# Patient Record
Sex: Male | Born: 1980 | Race: Black or African American | Hispanic: No | Marital: Single | Smoking: Current every day smoker
Health system: Southern US, Community
[De-identification: ages and names within clinical notes are randomized; demographics above are authoritative.]

## PROBLEM LIST (undated history)

## (undated) ENCOUNTER — Emergency Department (HOSPITAL_COMMUNITY): Admission: EM | Payer: Self-pay | Source: Home / Self Care

## (undated) DIAGNOSIS — J45909 Unspecified asthma, uncomplicated: Secondary | ICD-10-CM

## (undated) HISTORY — PX: HERNIA REPAIR: SHX51

## (undated) HISTORY — PX: ANKLE SURGERY: SHX546

---

## 1999-01-31 ENCOUNTER — Emergency Department (HOSPITAL_COMMUNITY): Admission: EM | Admit: 1999-01-31 | Discharge: 1999-01-31 | Payer: Self-pay | Admitting: Emergency Medicine

## 2000-07-25 ENCOUNTER — Encounter: Payer: Self-pay | Admitting: Emergency Medicine

## 2000-07-25 ENCOUNTER — Emergency Department (HOSPITAL_COMMUNITY): Admission: EM | Admit: 2000-07-25 | Discharge: 2000-07-25 | Payer: Self-pay | Admitting: Emergency Medicine

## 2001-07-31 ENCOUNTER — Emergency Department (HOSPITAL_COMMUNITY): Admission: EM | Admit: 2001-07-31 | Discharge: 2001-07-31 | Payer: Self-pay | Admitting: Emergency Medicine

## 2004-11-04 ENCOUNTER — Emergency Department (HOSPITAL_COMMUNITY): Admission: EM | Admit: 2004-11-04 | Discharge: 2004-11-04 | Payer: Self-pay | Admitting: Emergency Medicine

## 2008-12-21 ENCOUNTER — Emergency Department (HOSPITAL_COMMUNITY): Admission: EM | Admit: 2008-12-21 | Discharge: 2008-12-21 | Payer: Self-pay | Admitting: Family Medicine

## 2009-02-11 ENCOUNTER — Emergency Department (HOSPITAL_COMMUNITY): Admission: EM | Admit: 2009-02-11 | Discharge: 2009-02-11 | Payer: Self-pay | Admitting: Family Medicine

## 2010-07-11 ENCOUNTER — Emergency Department (HOSPITAL_COMMUNITY)
Admission: EM | Admit: 2010-07-11 | Discharge: 2010-07-11 | Payer: Self-pay | Source: Home / Self Care | Admitting: Emergency Medicine

## 2010-07-29 ENCOUNTER — Ambulatory Visit (HOSPITAL_COMMUNITY)
Admission: RE | Admit: 2010-07-29 | Discharge: 2010-07-30 | Payer: Self-pay | Source: Home / Self Care | Attending: Orthopedic Surgery | Admitting: Orthopedic Surgery

## 2010-07-29 LAB — COMPREHENSIVE METABOLIC PANEL
ALT: 34 U/L (ref 0–53)
AST: 33 U/L (ref 0–37)
Albumin: 3.7 g/dL (ref 3.5–5.2)
Alkaline Phosphatase: 78 U/L (ref 39–117)
BUN: 13 mg/dL (ref 6–23)
CO2: 26 mEq/L (ref 19–32)
Calcium: 9.2 mg/dL (ref 8.4–10.5)
Chloride: 107 mEq/L (ref 96–112)
Creatinine, Ser: 1.32 mg/dL (ref 0.4–1.5)
GFR calc Af Amer: >60 mL/min (ref >60)
GFR calc non Af Amer: >60 mL/min (ref >60)
Glucose, Bld: 87 mg/dL (ref 70–99)
Potassium: 3.9 mEq/L (ref 3.5–5.1)
Sodium: 139 mEq/L (ref 135–145)
Total Bilirubin: 0.8 mg/dL (ref 0.3–1.2)
Total Protein: 7.9 g/dL (ref 6.0–8.3)

## 2010-07-29 LAB — PROTIME-INR
INR: 1.11 (ref 0.00–1.49)
Prothrombin Time: 14.5 seconds (ref 11.6–15.2)

## 2010-07-29 LAB — URINALYSIS, ROUTINE W REFLEX MICROSCOPIC
Bilirubin Urine: NEGATIVE
Hgb urine dipstick: NEGATIVE
Ketones, ur: NEGATIVE mg/dL
Nitrite: NEGATIVE
Protein, ur: NEGATIVE mg/dL
Specific Gravity, Urine: 1.024 (ref 1.005–1.030)
Urine Glucose, Fasting: NEGATIVE mg/dL
Urobilinogen, UA: 0.2 mg/dL (ref 0.0–1.0)
pH: 6.5 (ref 5.0–8.0)

## 2010-07-29 LAB — CBC
HCT: 42.4 % (ref 39.0–52.0)
Hemoglobin: 13.8 g/dL (ref 13.0–17.0)
MCH: 29.8 pg (ref 26.0–34.0)
MCHC: 32.5 g/dL (ref 30.0–36.0)
MCV: 91.6 fL (ref 78.0–100.0)
Platelets: 278 10*3/uL (ref 150–400)
RBC: 4.63 MIL/uL (ref 4.22–5.81)
RDW: 14.2 % (ref 11.5–15.5)
WBC: 6.5 10*3/uL (ref 4.0–10.5)

## 2010-07-29 LAB — SURGICAL PCR SCREEN: Staphylococcus aureus: POSITIVE — AB

## 2010-07-29 LAB — APTT: aPTT: 31 seconds (ref 24–37)

## 2010-08-05 NOTE — Op Note (Signed)
[name], Jimmy Poole             ACCOUNT NO.:  [id number]  MEDICAL RECORD NO.:  [id number]          PATIENT TYPE:  OIB  LOCATION:  1303                         FACILITY:  [facility]  PHYSICIAN:  Ollen Gross, M.D.    DATE OF BIRTH:  Feb 22, 1981  DATE OF PROCEDURE:  07/29/2010 DATE OF DISCHARGE:                              OPERATIVE REPORT   PREOPERATIVE DIAGNOSIS:  Right lateral malleolus fracture.  POSTOPERATIVE DIAGNOSES: 1. Right lateral malleolus fracture. 2. Syndesmosis disruption.  PROCEDURE:  Open reduction and internal fixation of right lateral malleolus and syndesmosis.  SURGEON:  Ollen Gross, M.D.  ASSISTANT:  Avel Peace PA-C.  ANESTHESIA:  General.  ESTIMATED BLOOD LOSS:  Minimal.  DRAINS:  None.  TOURNIQUET TIME:  26 minutes at 280 mmHg.  COMPLICATIONS:  None.  CONDITION:  Stable to recovery.  CLINICAL NOTE:  [name] is a 30 year old male who had a right ankle fracture about 2-1/2 weeks ago.  He said he initially noticed that the foot was angulated and he reduced himself.  He was seen at [facility] ER and placed into a splint.  When I first saw him in the office about 3-4 post injury, the reduction was well maintained.  The next week, he came back and it shifted laterally.  We discussed operative fixation.  He presents now for ORIF.  PROCEDURE IN DETAIL:  After successful administration of general anesthetic, tourniquet placed on the right thigh, the right lower extremity was prepped and draped in usual sterile fashion.  Extremities wrapped in Esmarch tourniquet inflated to 300 mmHg.  The incision was made over the lateral malleolus.  Dissection down to bone.  Lateral malleolar fracture identified.  It is reduced.  There was atypical angulation that is not amenable to a lag screw.  When I reduced this under fluoro guidance, the syndesmosis did not fully reduce.  I placed the cancellus screw distal to the fracture, cortical screw proximal through a seven  hole one-third tubular side plate.  Under live fluoro, I then placed the angle in full range of motion, and I placed a clamp around the fibula.  I was able to disrupt the syndesmosis and that was the reason why it was not reducing.  When I reduced the syndesmosis, the talus was then centered in the ankle mortise.  Reduced the syndesmosis and then through the two holes just proximal to the fracture, I placed syndesmosis screws, one of them captured four cortices and on captured three.  This effectively reduced the syndesmosis to make the mortise symmetric and keep the talus symmetric in the mortise.  I then filled the rest of the holes proximal cortical screws for more cancellus screw distal to the fracture.  Multiple views were taken.  Showed anatomic reduction of fracture and the mortise.  Then, thoroughly irrigated the wound and closed with interrupted 2-0 Vicryl and staples.  Tourniquet released at total time of 26 minutes.  Incisions cleaned and dried and bulky sterile dressing applied.  He was placed into a Cam walker and then awakened and transferred to recovery in stable condition.     Ollen Gross, M.D.     FA/MEDQ  D:  07/29/2010  T:  07/29/2010  Job:  [id number]  Electronically Signed by Ollen Gross M.D. on 08/05/2010 03:37:33 PM

## 2010-12-23 ENCOUNTER — Emergency Department (HOSPITAL_COMMUNITY)
Admission: EM | Admit: 2010-12-23 | Discharge: 2010-12-23 | Disposition: A | Discharge status: Home / Self Care | Payer: Self-pay | Attending: Emergency Medicine | Admitting: Emergency Medicine

## 2010-12-23 DIAGNOSIS — H9190 Unspecified hearing loss, unspecified ear: Secondary | ICD-10-CM | POA: Insufficient documentation

## 2010-12-23 DIAGNOSIS — H6120 Impacted cerumen, unspecified ear: Secondary | ICD-10-CM | POA: Insufficient documentation

## 2012-01-15 ENCOUNTER — Emergency Department (HOSPITAL_COMMUNITY)
Admission: EM | Admit: 2012-01-15 | Discharge: 2012-01-15 | Disposition: A | Discharge status: Home / Self Care | Payer: Self-pay | Attending: Emergency Medicine | Admitting: Emergency Medicine

## 2012-01-15 ENCOUNTER — Encounter (HOSPITAL_COMMUNITY): Payer: Self-pay | Admitting: Emergency Medicine

## 2012-01-15 DIAGNOSIS — IMO0001 Reserved for inherently not codable concepts without codable children: Secondary | ICD-10-CM

## 2012-01-15 DIAGNOSIS — R531 Weakness: Secondary | ICD-10-CM

## 2012-01-15 DIAGNOSIS — F17200 Nicotine dependence, unspecified, uncomplicated: Secondary | ICD-10-CM | POA: Insufficient documentation

## 2012-01-15 DIAGNOSIS — K921 Melena: Secondary | ICD-10-CM | POA: Insufficient documentation

## 2012-01-15 HISTORY — DX: Unspecified asthma, uncomplicated: J45.909

## 2012-01-15 LAB — CBC WITH DIFFERENTIAL/PLATELET
Basophils Relative: 0 % (ref 0–1)
Eosinophils Absolute: 0 10*3/uL (ref 0.0–0.7)
MCH: 29.9 pg (ref 26.0–34.0)
MCHC: 33.1 g/dL (ref 30.0–36.0)
Monocytes Relative: 10 % (ref 3–12)
Neutrophils Relative %: 60 % (ref 43–77)
Platelets: 254 10*3/uL (ref 150–400)

## 2012-01-15 LAB — OCCULT BLOOD, POC DEVICE: Fecal Occult Bld: POSITIVE

## 2012-01-15 LAB — BASIC METABOLIC PANEL
BUN: 10 mg/dL (ref 6–23)
Calcium: 9.1 mg/dL (ref 8.4–10.5)
GFR calc Af Amer: 85 mL/min — ABNORMAL LOW (ref >90)
GFR calc non Af Amer: 74 mL/min — ABNORMAL LOW (ref >90)
Potassium: 3.3 mEq/L — ABNORMAL LOW (ref 3.5–5.1)
Sodium: 139 mEq/L (ref 135–145)

## 2012-01-15 MED ORDER — PANTOPRAZOLE SODIUM 40 MG IV SOLR
40.0000 mg | Freq: Once | INTRAVENOUS | Status: AC
  Administered 2012-01-15: 40 mg via INTRAVENOUS
  Filled 2012-01-15: qty 40

## 2012-01-15 MED ORDER — SODIUM CHLORIDE 0.9 % IV BOLUS (SEPSIS)
1000.0000 mL | Freq: Once | INTRAVENOUS | Status: AC
  Administered 2012-01-15: 1000 mL via INTRAVENOUS

## 2012-01-15 NOTE — ED Notes (Signed)
Pt reports blood in stool onset yesterday. Pt reports having 5 episodes of dark red blood in stool yesterday. Pt denies abdominal pain, N/V.

## 2012-01-15 NOTE — ED Notes (Signed)
Pt brought back to room from triage; pt undressing from waist down and putting on gown at this time

## 2012-01-15 NOTE — ED Provider Notes (Signed)
Medical screening examination/treatment/procedure(s) were performed by non-physician practitioner and as supervising physician I was immediately available for consultation/collaboration.   Gavin Pound. Trai Ells, MD 01/15/12 1347

## 2012-01-15 NOTE — ED Provider Notes (Signed)
History     CSN: [id number]  Arrival date & time 01/15/12  1148   First MD Initiated Contact with Patient 01/15/12 1157      Chief Complaint  Patient presents with  . Rectal Bleeding    (Consider location/radiation/quality/duration/timing/severity/associated sxs/prior treatment) HPI Comments: Patient here with sudden onset of 5 episodes of dark red blood in stool yesterday. Stool is formed but loose. No pain with defacation. Notices spots of dark blood in underpants. No abdominal pain, nausea, fever. Feels slightly lightheaded and weak. Drinking fluids. No recent travel. Denies any personal or family hx of IBD or GI problems. No hx of rectal bleeding.  Patient is a 31 y.o. male presenting with hematochezia. The history is provided by the patient.  Rectal Bleeding  The current episode started yesterday. The patient is experiencing no pain. The stool is described as mixed with blood. Pertinent negatives include no fever, no abdominal pain, no diarrhea, no nausea, no rectal pain, no chest pain and no rash.    Past Medical History  Diagnosis Date  . Asthma     Past Surgical History  Procedure Date  . Hernia repair   . Ankle surgery     History reviewed. No pertinent family history.  History  Substance Use Topics  . Smoking status: Current Everyday Smoker  . Smokeless tobacco: Not on file  . Alcohol Use: Yes      Review of Systems  Constitutional: Positive for fatigue. Negative for fever and appetite change.  Respiratory: Negative for shortness of breath.   Cardiovascular: Negative for chest pain.  Gastrointestinal: Positive for blood in stool and hematochezia. Negative for nausea, abdominal pain, diarrhea, constipation and rectal pain.  Genitourinary: Negative for dysuria and difficulty urinating.  Skin: Negative for rash.  Neurological: Positive for light-headedness.    Allergies  Review of patient's allergies indicates no known allergies.  Home Medications  No  current outpatient prescriptions on file.  BP 138/90  Pulse 97  Temp 98.5 F (36.9 C) (Oral)  Resp 16  SpO2 95%  Physical Exam  Nursing note and vitals reviewed. Constitutional: He is oriented to person, place, and time. He appears well-developed and well-nourished.  HENT:  Head: Normocephalic and atraumatic.  Mouth/Throat: Oropharynx is clear and moist.  Eyes: Conjunctivae are normal. No scleral icterus.  Cardiovascular: Normal rate, regular rhythm and normal heart sounds.   Pulmonary/Chest: Effort normal and breath sounds normal.  Abdominal: Soft. Bowel sounds are normal. There is no tenderness.  Genitourinary: Rectum normal. Rectal exam shows no external hemorrhoid, no internal hemorrhoid, no fissure and no tenderness. Prostate is not enlarged and not tender.  Neurological: He is alert and oriented to person, place, and time.  Skin: No rash noted. No pallor.    ED Course  Procedures (including critical care time)   Labs Reviewed  CBC WITH DIFFERENTIAL  BASIC METABOLIC PANEL  OCCULT BLOOD X 1 CARD TO LAB, STOOL   No results found. Results for orders placed during the hospital encounter of 01/15/12  CBC WITH DIFFERENTIAL      Component Value Range   WBC 7.3  4.0 - 10.5 K/uL   RBC 4.61  4.22 - 5.81 MIL/uL   Hemoglobin 13.8  13.0 - 17.0 g/dL   HCT 40.9  81.1 - 91.4 %   MCV 90.5  78.0 - 100.0 fL   MCH 29.9  26.0 - 34.0 pg   MCHC 33.1  30.0 - 36.0 g/dL   RDW 78.2  95.6 - 21.3 %  Platelets 254  150 - 400 K/uL   Neutrophils Relative 60  43 - 77 %   Neutro Abs 4.4  1.7 - 7.7 K/uL   Lymphocytes Relative 29  12 - 46 %   Lymphs Abs 2.1  0.7 - 4.0 K/uL   Monocytes Relative 10  3 - 12 %   Monocytes Absolute 0.7  0.1 - 1.0 K/uL   Eosinophils Relative 0  0 - 5 %   Eosinophils Absolute 0.0  0.0 - 0.7 K/uL   Basophils Relative 0  0 - 1 %   Basophils Absolute 0.0  0.0 - 0.1 K/uL  BASIC METABOLIC PANEL      Component Value Range   Sodium 139  135 - 145 mEq/L   Potassium 3.3  (*) 3.5 - 5.1 mEq/L   Chloride 101  96 - 112 mEq/L   CO2 25  19 - 32 mEq/L   Glucose, Bld 95  70 - 99 mg/dL   BUN 10  6 - 23 mg/dL   Creatinine, Ser 4.54  0.50 - 1.35 mg/dL   Calcium 9.1  8.4 - 09.8 mg/dL   GFR calc non Af Amer 74 (*) >90 mL/min   GFR calc Af Amer 85 (*) >90 mL/min  OCCULT BLOOD, POC DEVICE      Component Value Range   Fecal Occult Bld POSITIVE        No diagnosis found. Dx- blood in stool   MDM  Occult blood trace positive occult blood. Fluid management and await lab results. 1:26 PM Labs WNL. Patient feeling stronger since fluid bolus. No active bleeding seen on exam. Hold GI referral at this time. Explained possibility of internal hemorrhoid. Aware to return if s/s worsen or if he weakens. Stressed importance of hydration.         Trevor Mace, PA-C 01/15/12 1335

## 2012-01-15 NOTE — ED Notes (Signed)
Pt getting dressed to be discharged home at this time

## 2012-01-30 ENCOUNTER — Encounter (HOSPITAL_COMMUNITY): Payer: Self-pay | Admitting: Emergency Medicine

## 2012-01-30 ENCOUNTER — Emergency Department (HOSPITAL_COMMUNITY)
Admission: EM | Admit: 2012-01-30 | Discharge: 2012-01-30 | Disposition: A | Discharge status: Home / Self Care | Payer: Self-pay | Source: Home / Self Care | Attending: Emergency Medicine | Admitting: Emergency Medicine

## 2012-01-30 DIAGNOSIS — N342 Other urethritis: Secondary | ICD-10-CM

## 2012-01-30 MED ORDER — CEFTRIAXONE SODIUM 250 MG IJ SOLR
250.0000 mg | Freq: Once | INTRAMUSCULAR | Status: AC
  Administered 2012-01-30: 250 mg via INTRAMUSCULAR

## 2012-01-30 MED ORDER — AZITHROMYCIN 250 MG PO TABS
1000.0000 mg | ORAL_TABLET | Freq: Every day | ORAL | Status: DC
  Administered 2012-01-30: 1000 mg via ORAL

## 2012-01-30 MED ORDER — CEFTRIAXONE SODIUM 250 MG IJ SOLR
INTRAMUSCULAR | Status: AC
  Filled 2012-01-30: qty 250

## 2012-01-30 MED ORDER — AZITHROMYCIN 250 MG PO TABS
ORAL_TABLET | ORAL | Status: AC
  Filled 2012-01-30: qty 4

## 2012-01-30 NOTE — ED Provider Notes (Signed)
History     CSN: [id number]  Arrival date & time 01/30/12  1122   First MD Initiated Contact with Patient 01/30/12 1126      Chief Complaint  Patient presents with  . Penile Discharge    (Consider location/radiation/quality/duration/timing/severity/associated sxs/prior treatment) HPI Comments: Patient presents to urgent care this morning complaining of a 2 day history of a milky white to yellowish drainage from his penis. He describes a bit of discomfort with urination but no pain, no abdominal pain fevers nausea or vomiting. He does admit to having unprotected sex occasionally.  Patient is a 31 y.o. male presenting with penile discharge. The history is provided by the patient.  Penile Discharge The problem occurs constantly. The problem has been gradually worsening. Pertinent negatives include no abdominal pain and no headaches. Nothing relieves the symptoms. He has tried nothing for the symptoms.    Past Medical History  Diagnosis Date  . Asthma     Past Surgical History  Procedure Date  . Hernia repair   . Ankle surgery     History reviewed. No pertinent family history.  History  Substance Use Topics  . Smoking status: Current Everyday Smoker  . Smokeless tobacco: Not on file  . Alcohol Use: Yes      Review of Systems  Constitutional: Negative for fever and chills.  Gastrointestinal: Negative for abdominal pain.  Genitourinary: Positive for discharge. Negative for urgency, flank pain, decreased urine volume, penile swelling, scrotal swelling, genital sores, penile pain and testicular pain.  Neurological: Negative for headaches.    Allergies  Review of patient's allergies indicates no known allergies.  Home Medications  No current outpatient prescriptions on file.  BP 136/79  Pulse 64  Temp 98.3 F (36.8 C) (Oral)  Resp 16  SpO2 97%  Physical Exam  Nursing note and vitals reviewed. Constitutional: He appears well-developed and well-nourished.    Genitourinary:    No phimosis, paraphimosis, hypospadias, penile erythema or penile tenderness. Discharge found.  Neurological: He is alert.  Skin: Skin is warm.    ED Course  Procedures (including critical care time)   Labs Reviewed  GC/CHLAMYDIA PROBE AMP, GENITAL   No results found.   1. Urethritis       MDM  Moderate penile discharge most likely gonorrhea. Patient has been treated today with ceftriaxone and azithromycin.. Patient was also requesting a tetanus shot for routine immunizations I have explained to patient that he can [facility] for immunization purposes. He agrees and understands.        Jimmie Molly, MD 01/30/12 1328

## 2012-01-30 NOTE — ED Notes (Signed)
Onset 2 days ago with milky white drainage from penis. States has pain with voiding. Also requests a tetanus shot today.

## 2012-02-01 LAB — GC/CHLAMYDIA PROBE AMP, GENITAL: Chlamydia, DNA Probe: NEGATIVE

## 2012-02-03 ENCOUNTER — Telehealth (HOSPITAL_COMMUNITY): Payer: Self-pay | Admitting: *Deleted

## 2012-02-03 NOTE — ED Notes (Signed)
GC pos., Chlamydia neg. Pt. adequately treated with Rocephin and Zithromax. I called pt. Pt. verified x 2 and given results. Pt. told he was adequately treated.  Pt. instructed to notify his partner, no sex for 1 week and to practice safe sex. Pt. told he can get HIV testing at the [facility]. STD clinic but you have to call for an appointment.  DHHS form completed and faxed to the [facility].Vassie Moselle 02/03/2012

## 2012-08-15 ENCOUNTER — Encounter (HOSPITAL_COMMUNITY): Payer: Self-pay | Admitting: *Deleted

## 2012-08-15 ENCOUNTER — Emergency Department (HOSPITAL_COMMUNITY): Payer: Self-pay

## 2012-08-15 ENCOUNTER — Emergency Department (HOSPITAL_COMMUNITY)
Admission: EM | Admit: 2012-08-15 | Discharge: 2012-08-15 | Disposition: A | Discharge status: Home / Self Care | Payer: Self-pay | Attending: Emergency Medicine | Admitting: Emergency Medicine

## 2012-08-15 DIAGNOSIS — R1110 Vomiting, unspecified: Secondary | ICD-10-CM | POA: Insufficient documentation

## 2012-08-15 DIAGNOSIS — J189 Pneumonia, unspecified organism: Secondary | ICD-10-CM | POA: Insufficient documentation

## 2012-08-15 DIAGNOSIS — J029 Acute pharyngitis, unspecified: Secondary | ICD-10-CM | POA: Insufficient documentation

## 2012-08-15 DIAGNOSIS — J45909 Unspecified asthma, uncomplicated: Secondary | ICD-10-CM | POA: Insufficient documentation

## 2012-08-15 DIAGNOSIS — F17200 Nicotine dependence, unspecified, uncomplicated: Secondary | ICD-10-CM | POA: Insufficient documentation

## 2012-08-15 DIAGNOSIS — R509 Fever, unspecified: Secondary | ICD-10-CM | POA: Insufficient documentation

## 2012-08-15 MED ORDER — AZITHROMYCIN 250 MG PO TABS: 250.0000 mg | ORAL_TABLET | Freq: Every day | ORAL | Status: DC

## 2012-08-15 MED ORDER — AZITHROMYCIN 250 MG PO TABS
500.0000 mg | ORAL_TABLET | Freq: Once | ORAL | Status: AC
  Administered 2012-08-15: 500 mg via ORAL
  Filled 2012-08-15: qty 2

## 2012-08-15 MED ORDER — CEFTRIAXONE SODIUM 250 MG IJ SOLR
250.0000 mg | Freq: Once | INTRAMUSCULAR | Status: AC
  Administered 2012-08-15: 250 mg via INTRAMUSCULAR
  Filled 2012-08-15: qty 250

## 2012-08-15 NOTE — ED Notes (Signed)
Pt with hx of cough and sore throat x 3 days.  Pt also c/o vomiting "big globs of mucus).  Tonsils red with white exudate.

## 2012-08-15 NOTE — ED Provider Notes (Signed)
History  This chart was scribed for Jimmy Poole, nurse practitioner working with Jimmy Poole, by Bennett Scrape, ED Scribe. This patient was seen in room TR08C/TR08C and the patient's care was started at 7:31 PM.  CSN: [id number]  Arrival date & time 08/15/12  1759   First MD Initiated Contact with Patient 08/15/12 1931      Chief Complaint  Patient presents with  . Sore Throat  . Cough     Patient is a 32 y.o. male presenting with pharyngitis. The history is provided by the patient. No language interpreter was used.  Sore Throat This is a new problem. The current episode started more than 2 days ago. The problem occurs constantly. The problem has been gradually worsening. Pertinent negatives include no chest pain, no abdominal pain and no shortness of breath. The symptoms are aggravated by swallowing. Nothing relieves the symptoms. He has tried nothing for the symptoms.    Jimmy Poole is a 32 y.o. male who presents to the Emergency Department complaining of 3 days of gradual onset, gradually worsening, constant sore throat with associated cough productive of yellow mucus, fever and 7 episode of non-bloody post-tussive emesis. He states that he had intermittent fevers earlier in the week but deny having any within the past 24 hours. Temperature is 98.2 in the ED. He reports that the sore throat is worse with swallowing and the cough is better with sitting up. He denies taking OTC medications at home to improve symptoms. He denies CP, abdominal pain and diarrhea as associated symptoms. He has a h/o asthma and is a current everyday smoker and occasional alcohol user.   Past Medical History  Diagnosis Date  . Asthma     Past Surgical History  Procedure Laterality Date  . Hernia repair    . Ankle surgery      No family history on file.  History  Substance Use Topics  . Smoking status: Current Every Day Smoker -- 0.03 packs/day    Types: Cigarettes  . Smokeless  tobacco: Not on file  . Alcohol Use: 1.2 oz/week    2 Shots of liquor per week      Review of Systems  Constitutional: Positive for fever. Negative for chills.  HENT: Positive for sore throat. Negative for congestion.   Respiratory: Positive for cough. Negative for shortness of breath.   Cardiovascular: Negative for chest pain.  Gastrointestinal: Positive for vomiting. Negative for nausea and abdominal pain.  All other systems reviewed and are negative.    Allergies  Review of patient's allergies indicates no known allergies.  Home Medications  No current outpatient prescriptions on file.  Triage Vitals: BP 143/94  Pulse 92  Temp(Src) 98.2 F (36.8 C) (Oral)  SpO2 96%  Physical Exam  Nursing note and vitals reviewed. Constitutional: He is oriented to person, place, and time. He appears well-developed and well-nourished. No distress.  HENT:  Head: Normocephalic and atraumatic.  Cerumen in the right ear canal, left TM is normal, oropharynx are erythematous with tonsillar exudate  Eyes: Conjunctivae and EOM are normal. Pupils are equal, round, and reactive to light.  Neck: Normal range of motion. Neck supple. No tracheal deviation present.  No meningismus   Cardiovascular: Normal rate and regular rhythm.   Pulmonary/Chest: Effort normal and breath sounds normal. No respiratory distress.  Abdominal: Soft. There is no tenderness.  Musculoskeletal: Normal range of motion. He exhibits no edema.  Neurological: He is alert and oriented to person, place, and time.  Skin: Skin is warm and dry.  Psychiatric: He has a normal mood and affect. His behavior is normal.    ED Course  Procedures (including critical care time)  DIAGNOSTIC STUDIES: Oxygen Saturation is 96% on room air, adequate by my interpretation.    COORDINATION OF CARE: 7:54 PM- Advised pt of radiology results which shows possible PNA. Discussed discharge plan which includes antibiotics with pt and pt agreed to  plan. Also advised pt to follow up and pt agreed. Will give first dose of Zithromax with rocephin in the ED.   Labs Reviewed  RAPID STREP SCREEN  STREP A DNA PROBE   Dg Chest 2 View  08/15/2012  *RADIOLOGY REPORT*  Clinical Data: Shortness of breath.  Cough.  CHEST - 2 VIEW  Comparison: 07/11/2010  Findings: Cardiac and mediastinal contours appear unremarkable.  Vague density noted in the right upper lobe.  The lungs appear otherwise clear.  No pleural effusion noted.  IMPRESSION:  1.  Vague density projects over the right upper lobe.  This is technically nonspecific but could represent early pneumonia. Otherwise, no significant abnormality identified.   Original Report Authenticated By: Gaylyn Rong, M.D.      No diagnosis found.    MDM    I personally performed the services described in this documentation, which was scribed in my presence. The recorded information has been reviewed and is accurate.        Jimmye Norman, NP 08/15/12 2126

## 2012-08-16 NOTE — ED Provider Notes (Signed)
Medical screening examination/treatment/procedure(s) were performed by non-physician practitioner and as supervising physician I was immediately available for consultation/collaboration.  Gilda Crease, MD 08/16/12 [phone]

## 2012-08-18 LAB — STREP A DNA PROBE: Group A Strep Probe: NEGATIVE

## 2013-01-05 IMAGING — CR DG ANKLE COMPLETE 3+V*R*
3 series · 3 of 3 positions shown · non-contrast
Comparison: None.

CLINICAL DATA: Lateral right ankle pain following an injury.

RIGHT ANKLE - COMPLETE 3+ VIEW

[t ankle joint ap right]
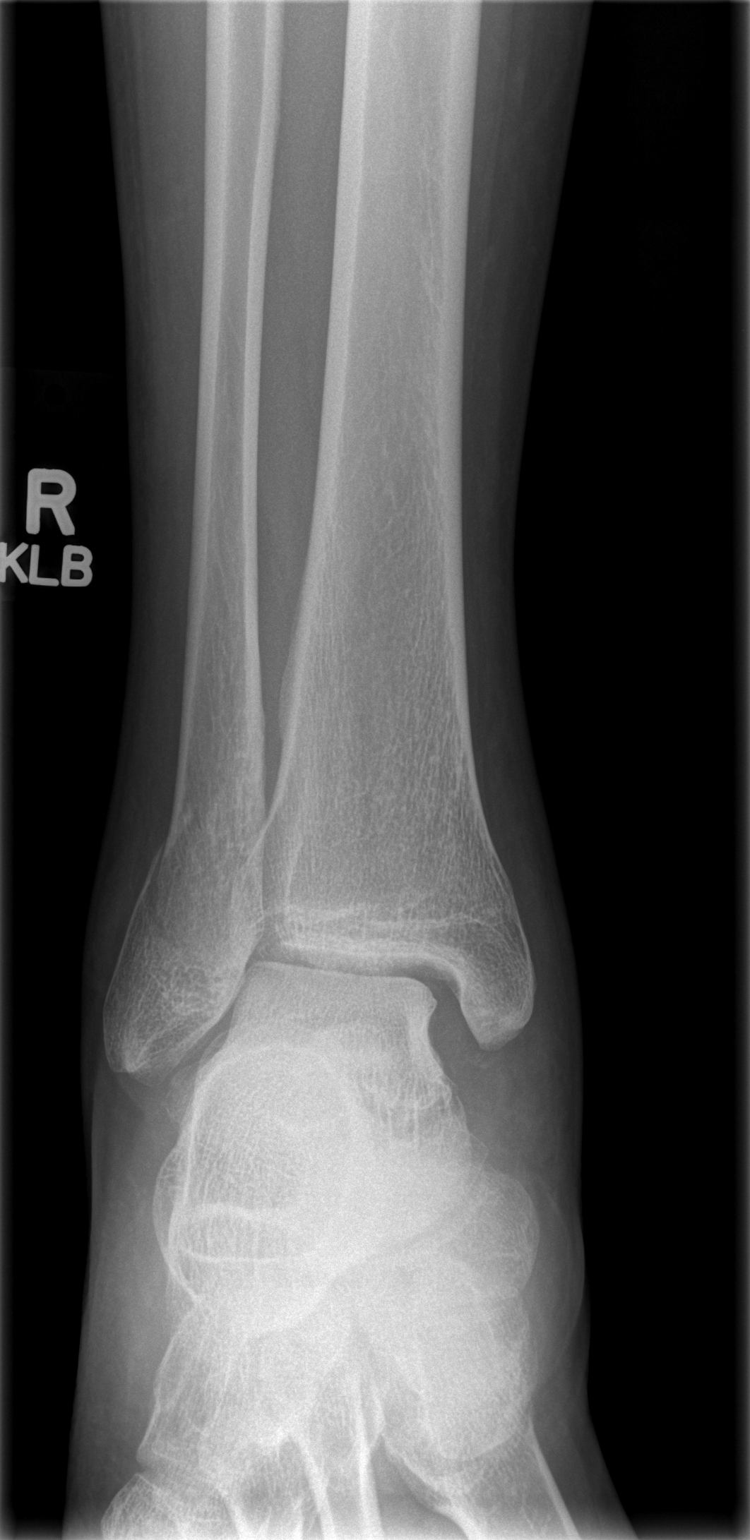

[t ankle joint oblique right]
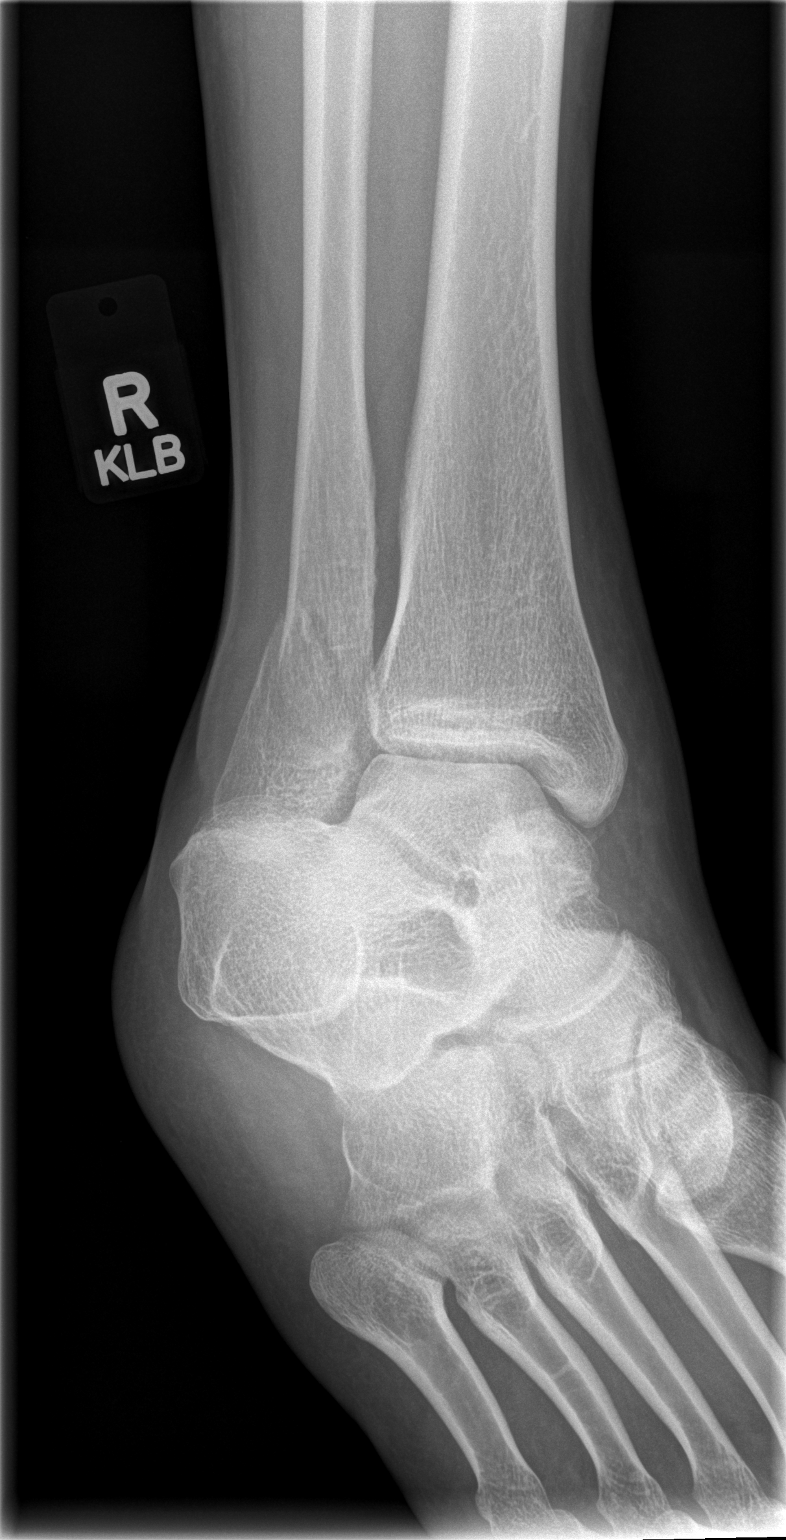

[t ankle joint lat right]
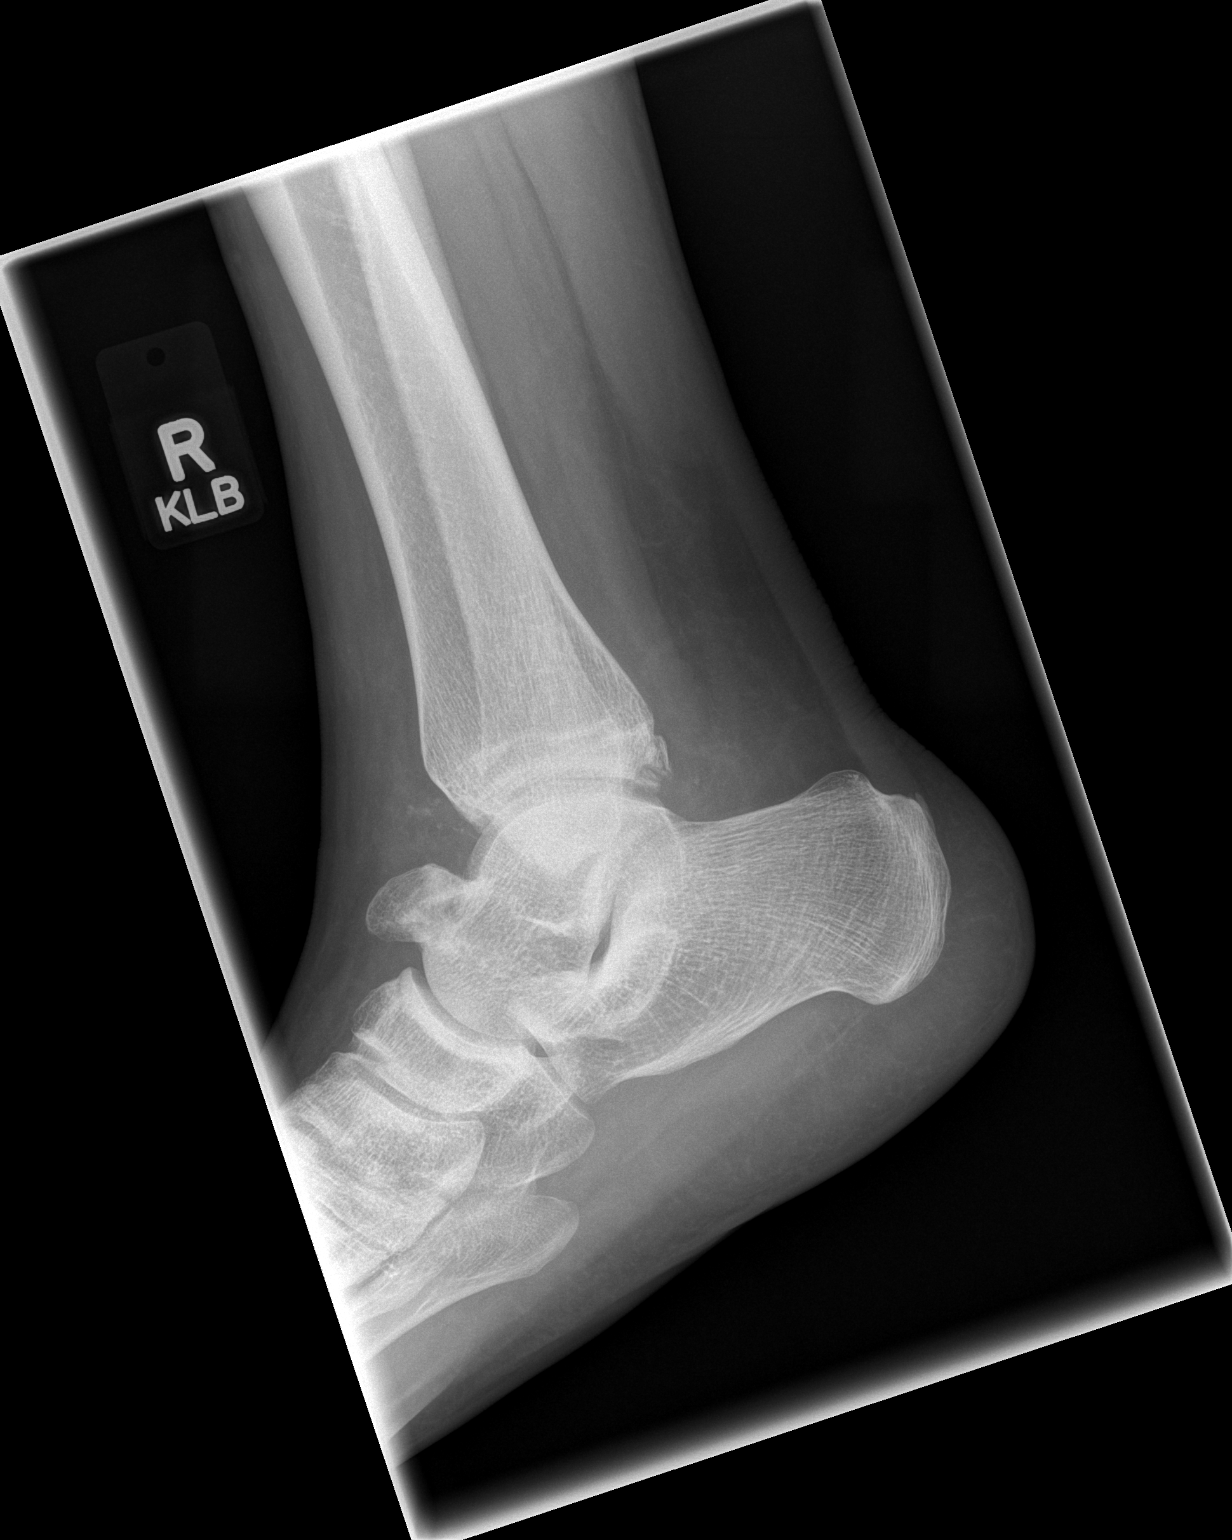

[3 of 3 positions shown; findings below may reference images not displayed]

FINDINGS: Oblique fracture through the proximal lateral malleolus
with mild lateral displacement and lateral angulation of the distal
fragment.  There is also widening of the space between the talus
and medial malleolus.  There is also bony irregularity of the
posterior aspect of the distal tibia.  A small amount of calcific
density is projected anterior to the distal tibia.  There is also a
prominent exostosis arising from the dorsal aspect of the distal
talus.
IMPRESSION: 1.
1.  Mildly displaced and angulated lateral malleolus fracture, as
described above.
2.  Widening of the space between the talus and medial malleolus,
compatible with ligament disruption.
3.  Bony irregularity of the posterior aspect of the distal tibia
at the talotibial joint.  This could be acute or chronic.
4.  Prominent exostosis arising from the dorsal talus distally.

## 2013-08-03 ENCOUNTER — Encounter (HOSPITAL_COMMUNITY): Payer: Self-pay | Admitting: Emergency Medicine

## 2013-08-03 DIAGNOSIS — J45901 Unspecified asthma with (acute) exacerbation: Secondary | ICD-10-CM | POA: Insufficient documentation

## 2013-08-03 DIAGNOSIS — J069 Acute upper respiratory infection, unspecified: Secondary | ICD-10-CM | POA: Insufficient documentation

## 2013-08-03 DIAGNOSIS — Z8701 Personal history of pneumonia (recurrent): Secondary | ICD-10-CM | POA: Insufficient documentation

## 2013-08-03 DIAGNOSIS — F17200 Nicotine dependence, unspecified, uncomplicated: Secondary | ICD-10-CM | POA: Insufficient documentation

## 2013-08-03 NOTE — ED Notes (Signed)
Nurse First Rounds : Nurse explained delay , wait time and process to pt. Respirations unlabored / occasional dry cough , pt. sitting with family watching TV .

## 2013-08-03 NOTE — ED Notes (Signed)
The pt is c/o  A cold cough and he feels weakness for 4 days since he found out his friend had the flu

## 2013-08-04 ENCOUNTER — Emergency Department (HOSPITAL_COMMUNITY): Payer: No Typology Code available for payment source

## 2013-08-04 ENCOUNTER — Emergency Department (HOSPITAL_COMMUNITY)
Admission: EM | Admit: 2013-08-04 | Discharge: 2013-08-04 | Disposition: A | Discharge status: Home / Self Care | Payer: No Typology Code available for payment source | Attending: Emergency Medicine | Admitting: Emergency Medicine

## 2013-08-04 DIAGNOSIS — J988 Other specified respiratory disorders: Secondary | ICD-10-CM

## 2013-08-04 DIAGNOSIS — B9789 Other viral agents as the cause of diseases classified elsewhere: Secondary | ICD-10-CM

## 2013-08-04 MED ORDER — IBUPROFEN 800 MG PO TABS: 800.0000 mg | ORAL_TABLET | Freq: Three times a day (TID) | ORAL | Status: DC

## 2013-08-04 NOTE — Discharge Instructions (Signed)
Take ibuprofen w/ food up to three times a day, as needed for pain, fever and chills.  Take sudafed as needed for nasal/sinus congestion.  Get rest, drink plenty of fluids and wash hands often to prevent spread of infection.  You should return to the ER if your cough worsens, you develop difficulty breathing or pain in your chest or symptoms have not started to improve in 5-7days.   °

## 2013-08-04 NOTE — ED Provider Notes (Signed)
CSN: [id number]     Arrival date & time 08/03/13  2103 History   First MD Initiated Contact with Patient 08/04/13 0133     Chief Complaint  Patient presents with  . Cough   (Consider location/radiation/quality/duration/timing/severity/associated sxs/prior Treatment) HPI History provided by pt.   Pt developed a non-productive cough 2 days ago.  Associated w/ exertional SOB and fatigue as well as nasal congestion and post-nasal drip.  No known fever and denies sore throat, chest pain, vomiting and diarrhea.  Has not taken anything for sx.  Friend diagnosed with the flu in the ED yesterday and he had similar sx.  Pt had pna last year and had SOB at that time.  He is concerned he has pna again.  Otherwise healthy. Past Medical History  Diagnosis Date  . Asthma    Past Surgical History  Procedure Laterality Date  . Hernia repair    . Ankle surgery     No family history on file. History  Substance Use Topics  . Smoking status: Current Every Day Smoker -- 0.03 packs/day    Types: Cigarettes  . Smokeless tobacco: Not on file  . Alcohol Use: 1.2 oz/week    2 Shots of liquor per week    Review of Systems  All other systems reviewed and are negative.    Allergies  Review of patient's allergies indicates no known allergies.  Home Medications  No current outpatient prescriptions on file. BP 138/77  Pulse 79  Temp(Src) 97.7 F (36.5 C) (Oral)  Resp 14  Wt 323 lb 12.8 oz (146.875 kg)  SpO2 99% Physical Exam  Nursing note and vitals reviewed. Constitutional: He is oriented to person, place, and time. He appears well-developed and well-nourished. No distress.  HENT:  Head: Normocephalic and atraumatic.  Mouth/Throat: Oropharynx is clear and moist. No oropharyngeal exudate.  Eyes:  Normal appearance  Neck: Normal range of motion.  Cardiovascular: Normal rate and regular rhythm.   Pulmonary/Chest: Effort normal and breath sounds normal. No respiratory distress.  No coughing   Musculoskeletal: Normal range of motion.  Lymphadenopathy:    He has no cervical adenopathy.  Neurological: He is alert and oriented to person, place, and time.  Skin: Skin is warm and dry. No rash noted.  Psychiatric: He has a normal mood and affect. His behavior is normal.    ED Course  Procedures (including critical care time) Labs Review Labs Reviewed - No data to display Imaging Review Dg Chest 2 View  08/04/2013   CLINICAL DATA:  Weakness.  History of pneumonia.  EXAM: CHEST  2 VIEW  COMPARISON:  Chest radiograph August 15, 2012  FINDINGS: Cardiomediastinal silhouette is unremarkable. The lungs are clear without pleural effusions or focal consolidations. Improved aeration of right upper lobe. Pulmonary vasculature is unremarkable. Trachea projects midline and there is no pneumothorax. Soft tissue planes and included osseous structures are nonsuspicious.  IMPRESSION: No acute cardiopulmonary process.   Electronically Signed   By: Awilda Metro   On: 08/04/2013 02:39    EKG Interpretation   None       MDM   1. Viral respiratory illness    33yo healthy M presents w/ cough and SOB.  Had CAP last winter and it presented similarly.  Friend diagnosed w/ influenza in ED yesterday.  On exam, afebrile, VS w/in nml range, NAD, unremarkable ENT and nml breath sounds, no respiratory distress. CXR ordered to r/o pna, mostly d/t patient's level of concern, and was negative.  Results  discussed w/ pt.  Precribed 800mg  ibuprofen and recommended rest and fluids for symptomatic treatment of what is likely a viral respiratory infection.  Return precautions discussed.     Otilio MiuCatherine E Reika Callanan, PA-C 08/04/13 1956

## 2013-08-07 NOTE — ED Provider Notes (Signed)
Medical screening examination/treatment/procedure(s) were performed by non-physician practitioner and as supervising physician I was immediately available for consultation/collaboration.  EKG Interpretation   None        Sunnie NielsenBrian Tyffany Waldrop, MD 08/07/13 2333

## 2014-03-25 ENCOUNTER — Emergency Department (HOSPITAL_COMMUNITY)
Admission: EM | Admit: 2014-03-25 | Discharge: 2014-03-25 | Disposition: A | Discharge status: Home / Self Care | Payer: No Typology Code available for payment source | Attending: Emergency Medicine | Admitting: Emergency Medicine

## 2014-03-25 ENCOUNTER — Encounter (HOSPITAL_COMMUNITY): Payer: Self-pay | Admitting: Emergency Medicine

## 2014-03-25 DIAGNOSIS — S41109A Unspecified open wound of unspecified upper arm, initial encounter: Secondary | ICD-10-CM | POA: Insufficient documentation

## 2014-03-25 DIAGNOSIS — Y929 Unspecified place or not applicable: Secondary | ICD-10-CM | POA: Insufficient documentation

## 2014-03-25 DIAGNOSIS — F17200 Nicotine dependence, unspecified, uncomplicated: Secondary | ICD-10-CM | POA: Insufficient documentation

## 2014-03-25 DIAGNOSIS — Y9389 Activity, other specified: Secondary | ICD-10-CM | POA: Diagnosis not present

## 2014-03-25 DIAGNOSIS — Z23 Encounter for immunization: Secondary | ICD-10-CM | POA: Diagnosis not present

## 2014-03-25 DIAGNOSIS — R58 Hemorrhage, not elsewhere classified: Secondary | ICD-10-CM

## 2014-03-25 DIAGNOSIS — Z792 Long term (current) use of antibiotics: Secondary | ICD-10-CM | POA: Insufficient documentation

## 2014-03-25 DIAGNOSIS — J45909 Unspecified asthma, uncomplicated: Secondary | ICD-10-CM | POA: Insufficient documentation

## 2014-03-25 DIAGNOSIS — W503XXA Accidental bite by another person, initial encounter: Secondary | ICD-10-CM | POA: Diagnosis not present

## 2014-03-25 DIAGNOSIS — IMO0002 Reserved for concepts with insufficient information to code with codable children: Secondary | ICD-10-CM

## 2014-03-25 MED ORDER — AMOXICILLIN-POT CLAVULANATE 875-125 MG PO TABS
1.0000 | ORAL_TABLET | Freq: Once | ORAL | Status: AC
  Administered 2014-03-25: 1 via ORAL
  Filled 2014-03-25: qty 1

## 2014-03-25 MED ORDER — IBUPROFEN 400 MG PO TABS
800.0000 mg | ORAL_TABLET | Freq: Once | ORAL | Status: AC
  Administered 2014-03-25: 800 mg via ORAL
  Filled 2014-03-25: qty 2

## 2014-03-25 MED ORDER — AMOXICILLIN-POT CLAVULANATE 875-125 MG PO TABS: 1.0000 | ORAL_TABLET | Freq: Two times a day (BID) | ORAL | Status: DC

## 2014-03-25 MED ORDER — TETANUS-DIPHTH-ACELL PERTUSSIS 5-2.5-18.5 LF-MCG/0.5 IM SUSP
0.5000 mL | Freq: Once | INTRAMUSCULAR | Status: AC
  Administered 2014-03-25: 0.5 mL via INTRAMUSCULAR
  Filled 2014-03-25: qty 0.5
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
## 2014-03-25 NOTE — Discharge Instructions (Signed)
Take your antibiotics as directed and to completion. You should never have any leftover antibiotics! Push fluids and stay well hydrated.   Wash the affected area with soap and water and apply a thin layer of topical antibiotic ointment. Do this every 12 hours.   Do not use rubbing alcohol or hydrogen peroxide.                        Look for signs of infection: if you see redness, if the area becomes warm, if pain increases sharply, there is discharge (pus), if red streaks appear or you develop fever or vomiting, RETURN immediately to the Emergency Department  for a recheck.   Do not hesitate to return to the emergency room for any new, worsening or concerning symptoms.  Please obtain primary care using resource guide below. But the minute you were seen in the emergency room and that they will need to obtain records for further outpatient management.   Human Bite Human bite wounds tend to become infected, even when they seem minor at first. Bite wounds of the hand can be serious because the tendons and joints are close to the skin. Infection can develop very rapidly, even in a matter of hours.  DIAGNOSIS  Your caregiver will most likely:  Take a detailed history of the bite injury.  Perform a wound exam.  Take your medical history. Blood tests or X-rays may be performed. Sometimes, infected bite wounds are cultured and sent to a lab to identify the infectious bacteria. TREATMENT  Medical treatment will depend on the location of the bite as well as the patient's medical history. Treatment may include:  Wound care, such as cleaning and flushing the wound with saline solution, bandaging, and elevating the affected area.  Antibiotic medicine.  Tetanus immunization.  Leaving the wound open to heal. This is often done with human bites due to the high risk of infection. However, in certain cases, wound closure with stitches, wound adhesive, skin adhesive strips, or staples may be  used. Infected bites that are left untreated may require intravenous (IV) antibiotics and surgical treatment in the hospital. HOME CARE INSTRUCTIONS  Follow your caregiver's instructions for wound care.  Take all medicines as directed.  If your caregiver prescribes antibiotics, take them as directed. Finish them even if you start to feel better.  Follow up with your caregiver for further exams or immunizations as directed. You may need a tetanus shot if:  You cannot remember when you had your last tetanus shot.  You have never had a tetanus shot.  The injury broke your skin. If you get a tetanus shot, your arm may swell, get red, and feel warm to the touch. This is common and not a problem. If you need a tetanus shot and you choose not to have one, there is a rare chance of getting tetanus. Sickness from tetanus can be serious. SEEK IMMEDIATE MEDICAL CARE IF:  You have increased pain, swelling, or redness around the bite wound.  You have chills.  You have a fever.  You have pus draining from the wound.  You have red streaks on the skin coming from the wound.  You have pain with movement or trouble moving the injured part.  You are not improving, or you are getting worse.  You have any other questions or concerns. MAKE SURE YOU:  Understand these instructions.  Will watch your condition.  Will get help right away if you are  not doing well or get worse. Document Released: 07/29/2004 Document Revised: 09/13/2011 Document Reviewed: 02/10/2011 [facility] Patient Information 2015 [place], [place]. This information is not intended to replace advice given to you by your health care provider. Make sure you discuss any questions you have with your health care provider.    Emergency Department Resource Guide 1) Find a Doctor and Pay Out of Pocket Although you won't have to find out who is covered by your insurance plan, it is a good idea to ask around and get recommendations. You  will then need to call the office and see if the doctor you have chosen will accept you as a new patient and what types of options they offer for patients who are self-pay. Some doctors offer discounts or will set up payment plans for their patients who do not have insurance, but you will need to ask so you aren't surprised when you get to your appointment.  2) Contact Your Local Health Department Not all health departments have doctors that can see patients for sick visits, but many do, so it is worth a call to see if yours does. If you don't know where your local health department is, you can check in your phone book. The CDC also has a tool to help you locate your state's health department, and many state websites also have listings of all of their local health departments.  3) Find a Walk-in Clinic If your illness is not likely to be very severe or complicated, you may want to try a walk in clinic. These are popping up all over the country in pharmacies, drugstores, and shopping centers. They're usually staffed by nurse practitioners or physician assistants that have been trained to treat common illnesses and complaints. They're usually fairly quick and inexpensive. However, if you have serious medical issues or chronic medical problems, these are probably not your best option.  No Primary Care Doctor: - Call Health Connect at  [phone] - they can help you locate a primary care doctor that  accepts your insurance, provides certain services, etc. - Physician Referral Service- [phone]  Chronic Pain Problems: Organization         Address  Phone   Notes  [facility]  [phone] Patients need to be referred by their primary care doctor.   Medication Assistance: Organization         Address  Phone   Notes  [facility] Medication [facility] [address] [place], [place] [address] [phone] --Must be a resident of [facility] -- Must  have NO insurance coverage whatsoever (no Medicaid/ Medicare, etc.) -- The pt. MUST have a primary care doctor that directs their care regularly and follows them in the community   [facility]ist  [phone]   [employer]  [phone]    Agencies that provide inexpensive medical care: Organization         Address  Phone   Notes  [facility]  [phone]   [facility]    [phone]   [facility] [address] [place], [place] [address] [phone]   [facility] [address] [place] [place] [phone]   Planned Parenthood    [phone]   [facility]    [phone]   [facility]  [address], [place] Phone:  [phone], Fax:  [phone] Hours of Operation:  9 am - 6 pm, M-F.  Also accepts Medicaid/Medicare and self-pay.  [facility] for Children  [address], [place] Phone: [phone], Fax: [phone]. Hours of Operation:  8:30 am - 5:30 pm, M-F.  Also accepts Medicaid and self-pay.  [facility] [place] [address] [place] Phone: [phone]   [facility] [address] Natasha Bence [place], [place] [phone], Ext. [phone] Mondays & Thursdays: 7-9 AM.  First 15 patients are seen on a first come, first serve basis.    Medicaid-accepting [facility] Providers:  Organization         Address  Phone   Notes  [facility] [address], [place] [phone] Also accepts self-pay patients.  [facility] [address] Laurell Josephs [place], [place]  [phone]   [facility] [address], [place] [phone]   [facility] [address], [place] [phone]   Renaye Rakers [address], [place]   [phone] Only accepts [place] Access [place] patients after  they have their name applied to their card.   Self-Pay (no insurance) in [facility]:  Organization         Address  Phone   Notes  Sickle Cell Patients, [facility] Internal Medicine [address] [place], [place] [phone]   [facility] Urgent Care [address] [place], [place] [phone]   [facility] [place]  [address], [place] [phone]   [facility]/Dr. Osei-Bonsu  [address] [place] or [address], [place] [phone] Phone number for both [place] and [place] locations is the same.  [facility] [address] [place] [phone]   [facility] [address] [place] or [address] [phone]   [facility] [address] [place] [phone], phone; [phone], fax Sees patients 1st and 3rd Saturday of every month.  Must not qualify for public or private insurance (i.e. Medicaid, Medicare, [place] Health Choice, Veterans' Benefits)  Household income should be no more than 200% of the poverty level The clinic cannot treat you if you are pregnant or think you are pregnant  Sexually transmitted diseases are not treated at the clinic.    Dental Care: Organization         Address  Phone  Notes  [facility] Department of [facility] [address] [place], [place] [phone] Accepts children up to age 57 who are enrolled in [place] or [place] Health Choice; pregnant women with a Medicaid card; and children who have applied for Medicaid or [place] Health Choice, but were declined, whose parents can pay a reduced fee at time of service.  [facility] of [facility]  [address], [place] [phone] Accepts children up to age 54 who are enrolled in [place] or [place] Health Choice; pregnant women with a Medicaid card; and children who  have applied for Medicaid or [place] Health Choice, but were declined, whose parents can pay a reduced fee at time of service.  [facility]  [address] [place], [place] [phone] Patients are seen by appointment only. Walk-ins are not accepted. [facility] will see patients 18 years  of age and older. Monday - Tuesday (8am-5pm) Most Wednesdays (8:30-5pm) $30 per visit, cash only  [facility] Adult Dental Access PROGRAM  [address], [facility] [phone] Patients are seen by appointment only. Walk-ins are not accepted. [facility] will see patients 17 years of age and older. One Wednesday Evening (Monthly: Volunteer Based).  $30 per visit, cash only  [employer]  [phone] for adults; Children under age 32, call Graduate Pediatric Dentistry at [phone]. Children aged 70-14, please call [phone] to request a pediatric application.  Dental services are provided in all areas of dental care including fillings, crowns and bridges, complete and partial dentures, implants, gum treatment, root canals, and extractions. Preventive care is also provided. Treatment is provided to both adults and children. Patients are selected via a lottery and there is often a waiting list.   [facility] [address], [place]  [phone] [email or web address]   [facility] [address] [place], [place] [phone], Ext. [phone] Second and Fourth Thursday of each month, opens at 6:30 AM; Clinic ends at 9 AM.  Patients are seen on a first-come first-served basis, and a limited number are seen during each clinic.   [facility]  [address] Ether Griffins [place], [place] [phone]   Eligibility Requirements You must have lived in [place], [place], or [place] counties for at least the last three months.   You cannot be eligible for state or federal sponsored [place], including [employer], [place], or [employer].   You generally cannot be eligible for healthcare insurance through your employer.    How to apply: Eligibility screenings are held every Tuesday and Wednesday afternoon from 1:00 pm until 4:00 pm. You do not need an appointment for the interview!  [facility] [address] [place], [place] [phone]   [facility] Health Department  [phone]   [facility] Health Department  [phone]   [facility]  [phone]    Behavioral Health Resources in the Community: Intensive Outpatient Programs Organization         Address  Phone  Notes  [facility] [address], [place], [place] [phone]   [facility] [address] [place], [place] [phone]   [facility] [address] [place], [place]  [phone]   [facility] Mental Health [address],  [place], [place] [phone] or [phone]   Substance Abuse Resources Organization         Address  Phone  Notes  [facility]  [phone]   [facility]  [phone]   [facility] [place]  [phone]   Floydene Flock  [phone]   Residential & Outpatient Substance Abuse Program  [phone]   Psychological Services Organization         Address  Phone  Notes  [facility] Behavioral Health  [phone]   [facility] Services  [phone]   [facility] [address], [place] [phone] or [phone]    Mobile Crisis Teams Organization         Address  Phone  Notes  [facility], Mobile Crisis Care Unit  [phone]   [facility]  [address] [place], [place] [phone]   [facility] [address] [place] [phone]    Self-Help/Support Groups Organization  Address  Phone             Notes  [facility] [place] -  variety of support groups  336- I7437963 Call for more information  Narcotics Anonymous (NA), [facility] [address], [employer] [place]  2 meetings at this location   Statistician         Address  Phone  Notes  [facility] [address],    [place]  [phone]   [facility]  [address] [place] [address], [place], [place] [phone]   [facility] [address] [place], [place] [phone] Admissions: 8am-3pm M-F  [facility] [address]    [place], [place] [phone]   [facility] [address] [place], [place], [place] [phone]   [facility] [address],  [place], [place] [phone]   [facility] - Intensive Outpatient [address] Laurell Josephs [address], [place], [place] [phone]   [facility] ([facility] [address] [place].,  [place], [place] [phone] or [phone]   [facility] ([facility]) [address] [place], [place] [phone] Accepts Medicaid  [facility] [address]  [place] [phone] Substance Abuse/Addiction Treatment   [facility] Organization         Address  Phone  Notes  [facility]  [phone]   Angie Fava, PhD [address] Ervin Knack [place], [place]   [phone] or [phone]   [facility]   [address] [place], [place] [phone]   [facility] [address] [place], [place] [phone] Insurance/Medicaid/sponsorship through [facility] and Families [address]                                    [place], [place] [phone] Therapy/tele-psych/case  [facility] [address], [place] [phone]    Dr. Lolly Mustache  [phone]   [facility] [place]  [employer] [facility] Dept. 1) [address] [place] 2) [address] [place] 3)  [address], [place] [phone]  [phone]   [facility] Child Abuse Hotline [phone] or [phone] (After Hours)

## 2014-03-25 NOTE — ED Notes (Signed)
Refused wheelchair 
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
## 2014-03-27 NOTE — ED Provider Notes (Signed)
Medical screening examination/treatment/procedure(s) were performed by non-physician practitioner and as supervising physician I was immediately available for consultation/collaboration.   EKG Interpretation None        Mirian Mo, MD 03/27/14 1946

## 2015-06-01 ENCOUNTER — Encounter (HOSPITAL_COMMUNITY): Payer: Self-pay

## 2015-06-01 ENCOUNTER — Emergency Department (HOSPITAL_COMMUNITY)
Admission: EM | Admit: 2015-06-01 | Discharge: 2015-06-01 | Disposition: A | Discharge status: Home / Self Care | Payer: No Typology Code available for payment source | Attending: Emergency Medicine | Admitting: Emergency Medicine

## 2015-06-01 ENCOUNTER — Emergency Department (HOSPITAL_COMMUNITY): Payer: No Typology Code available for payment source

## 2015-06-01 DIAGNOSIS — S51832A Puncture wound without foreign body of left forearm, initial encounter: Secondary | ICD-10-CM

## 2015-06-01 DIAGNOSIS — Y9389 Activity, other specified: Secondary | ICD-10-CM | POA: Insufficient documentation

## 2015-06-01 DIAGNOSIS — S51802A Unspecified open wound of left forearm, initial encounter: Secondary | ICD-10-CM | POA: Insufficient documentation

## 2015-06-01 DIAGNOSIS — Z23 Encounter for immunization: Secondary | ICD-10-CM | POA: Insufficient documentation

## 2015-06-01 DIAGNOSIS — F17210 Nicotine dependence, cigarettes, uncomplicated: Secondary | ICD-10-CM | POA: Insufficient documentation

## 2015-06-01 DIAGNOSIS — W3400XA Accidental discharge from unspecified firearms or gun, initial encounter: Secondary | ICD-10-CM

## 2015-06-01 DIAGNOSIS — S52353A Displaced comminuted fracture of shaft of radius, unspecified arm, initial encounter for closed fracture: Secondary | ICD-10-CM

## 2015-06-01 DIAGNOSIS — Y9289 Other specified places as the place of occurrence of the external cause: Secondary | ICD-10-CM | POA: Insufficient documentation

## 2015-06-01 DIAGNOSIS — J45909 Unspecified asthma, uncomplicated: Secondary | ICD-10-CM | POA: Insufficient documentation

## 2015-06-01 DIAGNOSIS — S52352A Displaced comminuted fracture of shaft of radius, left arm, initial encounter for closed fracture: Secondary | ICD-10-CM | POA: Insufficient documentation

## 2015-06-01 DIAGNOSIS — Y998 Other external cause status: Secondary | ICD-10-CM | POA: Insufficient documentation

## 2015-06-01 LAB — COMPREHENSIVE METABOLIC PANEL
ALBUMIN: 4.5 g/dL (ref 3.5–5.0)
ALT: 27 U/L (ref 17–63)
ANION GAP: 9 (ref 5–15)
AST: 73 U/L — ABNORMAL HIGH (ref 15–41)
Alkaline Phosphatase: 72 U/L (ref 38–126)
BUN: 13 mg/dL (ref 6–20)
CHLORIDE: 105 mmol/L (ref 101–111)
CO2: 24 mmol/L (ref 22–32)
Calcium: 9.5 mg/dL (ref 8.9–10.3)
Creatinine, Ser: 1.42 mg/dL — ABNORMAL HIGH (ref 0.61–1.24)
GFR calc non Af Amer: >60 mL/min (ref >60)
Glucose, Bld: 88 mg/dL (ref 65–99)
Potassium: 4.9 mmol/L (ref 3.5–5.1)
SODIUM: 138 mmol/L (ref 135–145)
Total Bilirubin: 2.1 mg/dL — ABNORMAL HIGH (ref 0.3–1.2)
Total Protein: 8.6 g/dL — ABNORMAL HIGH (ref 6.5–8.1)

## 2015-06-01 LAB — CBC
HCT: 52 % (ref 39.0–52.0)
HEMOGLOBIN: 16.9 g/dL (ref 13.0–17.0)
MCH: 30.5 pg (ref 26.0–34.0)
MCHC: 32.5 g/dL (ref 30.0–36.0)
MCV: 93.9 fL (ref 78.0–100.0)
PLATELETS: 271 10*3/uL (ref 150–400)
RBC: 5.54 MIL/uL (ref 4.22–5.81)
RDW: 14.6 % (ref 11.5–15.5)
WBC: 13.7 10*3/uL — ABNORMAL HIGH (ref 4.0–10.5)

## 2015-06-01 LAB — SAMPLE TO BLOOD BANK

## 2015-06-01 MED ORDER — OXYCODONE-ACETAMINOPHEN 5-325 MG PO TABS: 1.0000 | ORAL_TABLET | ORAL | Status: DC | PRN

## 2015-06-01 MED ORDER — MORPHINE SULFATE (PF) 4 MG/ML IV SOLN
6.0000 mg | Freq: Once | INTRAVENOUS | Status: AC
  Administered 2015-06-01: 6 mg via INTRAVENOUS
  Filled 2015-06-01: qty 2

## 2015-06-01 MED ORDER — TETANUS-DIPHTH-ACELL PERTUSSIS 5-2.5-18.5 LF-MCG/0.5 IM SUSP
0.5000 mL | Freq: Once | INTRAMUSCULAR | Status: AC
  Administered 2015-06-01: 0.5 mL via INTRAMUSCULAR
  Filled 2015-06-01: qty 0.5

## 2015-06-01 MED ORDER — MORPHINE SULFATE (PF) 10 MG/ML IV SOLN
10.0000 mg | Freq: Once | INTRAVENOUS | Status: DC
  Filled 2015-06-01: qty 1

## 2015-06-01 MED ORDER — CEFAZOLIN SODIUM 1-5 GM-% IV SOLN
1.0000 g | Freq: Once | INTRAVENOUS | Status: AC
  Administered 2015-06-01: 1 g via INTRAVENOUS
  Filled 2015-06-01: qty 50

## 2015-06-01 MED ORDER — CEPHALEXIN 500 MG PO CAPS: 500.0000 mg | ORAL_CAPSULE | Freq: Four times a day (QID) | ORAL | Status: DC

## 2015-06-01 NOTE — ED Notes (Signed)
Dr. Anitra LauthPlunkett informed  Of pt. Being discharged by Dr. Janee Mornhompson and will be following up with Out patient surgery tomorrow morning at 7:15 am
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
## 2015-06-01 NOTE — Discharge Instructions (Addendum)
Discharge Instructions DO NOT EAT OR DRINK ANYTHING AFTER MIDNIGHT TONIGHT!!!  You have a dressing with a plaster splint incorporated in it. Move your fingers as much as possible, making a full fist and fully opening the fist. Elevate your hand to reduce pain & swelling of the digits.  Ice over the operative site may be helpful to reduce pain & swelling.  DO NOT USE HEAT. Pain medicine has been prescribed for you.    Please call [phone] during normal business hours or [phone] after hours for any problems. Including the following:  - excessive redness of the incisions - drainage for more than 4 days - fever of more than 101.5 F  *Please note that pain medications will not be refilled after hours or on weekends.

## 2015-06-01 NOTE — Progress Notes (Signed)
Orthopedic Tech Progress Note Patient Details:  Corena PilgrimDonald L Thurman 03/14/1981 [id number]  Ortho Devices Type of Ortho Device: Ace wrap, Arm sling, Sugartong splint Ortho Device/Splint Location: LUE Ortho Device/Splint Interventions: Ordered, Application   Jennye MoccasinHughes, Ousmane Seeman Craig 06/01/2015, 6:55 PM

## 2015-06-02 ENCOUNTER — Ambulatory Visit (HOSPITAL_BASED_OUTPATIENT_CLINIC_OR_DEPARTMENT_OTHER)
Admission: AD | Admit: 2015-06-02 | Discharge: 2015-06-02 | Payer: No Typology Code available for payment source | Source: Ambulatory Visit | Attending: Orthopedic Surgery | Admitting: Orthopedic Surgery

## 2015-06-02 ENCOUNTER — Ambulatory Visit (HOSPITAL_BASED_OUTPATIENT_CLINIC_OR_DEPARTMENT_OTHER): Payer: No Typology Code available for payment source | Admitting: Anesthesiology

## 2015-06-02 ENCOUNTER — Encounter (HOSPITAL_BASED_OUTPATIENT_CLINIC_OR_DEPARTMENT_OTHER): Payer: Self-pay

## 2015-06-02 ENCOUNTER — Ambulatory Visit (HOSPITAL_COMMUNITY): Payer: No Typology Code available for payment source

## 2015-06-02 ENCOUNTER — Encounter (HOSPITAL_BASED_OUTPATIENT_CLINIC_OR_DEPARTMENT_OTHER): Admission: AD | Payer: Self-pay | Source: Ambulatory Visit | Attending: Orthopedic Surgery

## 2015-06-02 DIAGNOSIS — Z1810 Retained metal fragments, unspecified: Secondary | ICD-10-CM | POA: Insufficient documentation

## 2015-06-02 DIAGNOSIS — T148XXA Other injury of unspecified body region, initial encounter: Secondary | ICD-10-CM

## 2015-06-02 DIAGNOSIS — F17210 Nicotine dependence, cigarettes, uncomplicated: Secondary | ICD-10-CM | POA: Insufficient documentation

## 2015-06-02 DIAGNOSIS — S52252B Displaced comminuted fracture of shaft of ulna, left arm, initial encounter for open fracture type I or II: Secondary | ICD-10-CM | POA: Insufficient documentation

## 2015-06-02 DIAGNOSIS — J45909 Unspecified asthma, uncomplicated: Secondary | ICD-10-CM | POA: Insufficient documentation

## 2015-06-02 HISTORY — PX: ORIF ULNAR FRACTURE: SHX5417

## 2015-06-02 SURGERY — OPEN REDUCTION INTERNAL FIXATION (ORIF) ULNAR FRACTURE
Anesthesia: General | Site: Arm Lower | Laterality: Left

## 2015-06-02 MED ORDER — FENTANYL CITRATE (PF) 100 MCG/2ML IJ SOLN
INTRAMUSCULAR | Status: AC
Start: 2015-06-02 — End: 2015-06-02
  Filled 2015-06-02: qty 2

## 2015-06-02 MED ORDER — CEPHALEXIN 500 MG PO CAPS: 500.0000 mg | ORAL_CAPSULE | Freq: Four times a day (QID) | ORAL | Status: DC

## 2015-06-02 MED ORDER — FENTANYL CITRATE (PF) 100 MCG/2ML IJ SOLN
INTRAMUSCULAR | Status: AC
  Filled 2015-06-02: qty 2

## 2015-06-02 MED ORDER — KETOROLAC TROMETHAMINE 30 MG/ML IJ SOLN
INTRAMUSCULAR | Status: AC
Start: 2015-06-02 — End: 2015-06-02
  Filled 2015-06-02: qty 1

## 2015-06-02 MED ORDER — DEXTROSE 5 % IV SOLN
3.0000 g | INTRAVENOUS | Status: AC
  Administered 2015-06-02: 3 g via INTRAVENOUS

## 2015-06-02 MED ORDER — OXYCODONE HCL 5 MG PO TABS
5.0000 mg | ORAL_TABLET | ORAL | Status: DC | PRN
  Administered 2015-06-02: 5 mg via ORAL

## 2015-06-02 MED ORDER — ONDANSETRON HCL 4 MG/2ML IJ SOLN
INTRAMUSCULAR | Status: AC
  Filled 2015-06-02: qty 2

## 2015-06-02 MED ORDER — MIDAZOLAM HCL 2 MG/2ML IJ SOLN
1.0000 mg | INTRAMUSCULAR | Status: DC | PRN
  Administered 2015-06-02: 2 mg via INTRAVENOUS

## 2015-06-02 MED ORDER — LIDOCAINE HCL (CARDIAC) 20 MG/ML IV SOLN
INTRAVENOUS | Status: DC | PRN
  Administered 2015-06-02: 100 mg via INTRAVENOUS

## 2015-06-02 MED ORDER — DEXAMETHASONE SODIUM PHOSPHATE 10 MG/ML IJ SOLN
INTRAMUSCULAR | Status: DC | PRN
  Administered 2015-06-02: 10 mg via INTRAVENOUS

## 2015-06-02 MED ORDER — OXYCODONE HCL 5 MG PO TABS
ORAL_TABLET | ORAL | Status: AC
  Filled 2015-06-02: qty 1

## 2015-06-02 MED ORDER — PROPOFOL 10 MG/ML IV BOLUS
INTRAVENOUS | Status: DC | PRN
  Administered 2015-06-02: 100 mg via INTRAVENOUS
  Administered 2015-06-02: 50 mg via INTRAVENOUS
  Administered 2015-06-02: 400 mg via INTRAVENOUS
  Administered 2015-06-02 (×2): 50 mg via INTRAVENOUS
  Administered 2015-06-02: 100 mg via INTRAVENOUS

## 2015-06-02 MED ORDER — BUPIVACAINE-EPINEPHRINE 0.5% -1:200000 IJ SOLN
INTRAMUSCULAR | Status: DC | PRN
  Administered 2015-06-02: 20 mL

## 2015-06-02 MED ORDER — METOPROLOL TARTRATE 1 MG/ML IV SOLN
INTRAVENOUS | Status: DC | PRN
  Administered 2015-06-02: 2.5 mg via INTRAVENOUS

## 2015-06-02 MED ORDER — FENTANYL CITRATE (PF) 100 MCG/2ML IJ SOLN
25.0000 ug | INTRAMUSCULAR | Status: DC | PRN
  Administered 2015-06-02: 50 ug via INTRAVENOUS
  Administered 2015-06-02 (×2): 25 ug via INTRAVENOUS

## 2015-06-02 MED ORDER — LIDOCAINE HCL (CARDIAC) 20 MG/ML IV SOLN
INTRAVENOUS | Status: AC
  Filled 2015-06-02: qty 5

## 2015-06-02 MED ORDER — PROPOFOL 10 MG/ML IV BOLUS
INTRAVENOUS | Status: AC
  Filled 2015-06-02: qty 20

## 2015-06-02 MED ORDER — MIDAZOLAM HCL 2 MG/2ML IJ SOLN
INTRAMUSCULAR | Status: AC
  Filled 2015-06-02: qty 2

## 2015-06-02 MED ORDER — KETOROLAC TROMETHAMINE 30 MG/ML IJ SOLN
30.0000 mg | Freq: Once | INTRAMUSCULAR | Status: AC
  Administered 2015-06-02: 30 mg via INTRAVENOUS

## 2015-06-02 MED ORDER — CEFAZOLIN SODIUM 1-5 GM-% IV SOLN
INTRAVENOUS | Status: AC
  Filled 2015-06-02: qty 50

## 2015-06-02 MED ORDER — METOPROLOL TARTRATE 1 MG/ML IV SOLN
INTRAVENOUS | Status: AC
  Filled 2015-06-02: qty 5

## 2015-06-02 MED ORDER — LACTATED RINGERS IV SOLN
500.0000 mL | INTRAVENOUS | Status: DC
  Administered 2015-06-02 (×3): via INTRAVENOUS

## 2015-06-02 MED ORDER — ACETAMINOPHEN 10 MG/ML IV SOLN
1000.0000 mg | Freq: Once | INTRAVENOUS | Status: AC
  Administered 2015-06-02: 1000 mg via INTRAVENOUS

## 2015-06-02 MED ORDER — 0.9 % SODIUM CHLORIDE (POUR BTL) OPTIME
TOPICAL | Status: DC | PRN
  Administered 2015-06-02: 400 mL

## 2015-06-02 MED ORDER — KETOROLAC TROMETHAMINE 30 MG/ML IJ SOLN: 60.0000 mg | Freq: Once | INTRAMUSCULAR | Status: DC

## 2015-06-02 MED ORDER — PROMETHAZINE HCL 25 MG/ML IJ SOLN: 6.2500 mg | INTRAMUSCULAR | Status: DC | PRN

## 2015-06-02 MED ORDER — GLYCOPYRROLATE 0.2 MG/ML IJ SOLN: 0.2000 mg | Freq: Once | INTRAMUSCULAR | Status: DC | PRN

## 2015-06-02 MED ORDER — FENTANYL CITRATE (PF) 100 MCG/2ML IJ SOLN
50.0000 ug | INTRAMUSCULAR | Status: AC | PRN
  Administered 2015-06-02: 100 ug via INTRAVENOUS
  Administered 2015-06-02: 50 ug via INTRAVENOUS
  Administered 2015-06-02: 100 ug via INTRAVENOUS
  Administered 2015-06-02 (×3): 50 ug via INTRAVENOUS

## 2015-06-02 MED ORDER — LACTATED RINGERS IV SOLN
INTRAVENOUS | Status: DC
  Administered 2015-06-02: 10:00:00 via INTRAVENOUS

## 2015-06-02 MED ORDER — TRAMADOL HCL 50 MG PO TABS: 50.0000 mg | ORAL_TABLET | Freq: Four times a day (QID) | ORAL | Status: DC | PRN

## 2015-06-02 MED ORDER — DEXAMETHASONE SODIUM PHOSPHATE 10 MG/ML IJ SOLN
INTRAMUSCULAR | Status: AC
  Filled 2015-06-02: qty 1

## 2015-06-02 MED ORDER — PROPOFOL 10 MG/ML IV BOLUS
INTRAVENOUS | Status: AC
  Filled 2015-06-02: qty 40

## 2015-06-02 MED ORDER — ACETAMINOPHEN 10 MG/ML IV SOLN
INTRAVENOUS | Status: AC
  Filled 2015-06-02: qty 100

## 2015-06-02 MED ORDER — ONDANSETRON HCL 4 MG/2ML IJ SOLN
INTRAMUSCULAR | Status: DC | PRN
  Administered 2015-06-02: 4 mg via INTRAVENOUS

## 2015-06-02 MED ORDER — SCOPOLAMINE 1 MG/3DAYS TD PT72: 1.0000 | MEDICATED_PATCH | Freq: Once | TRANSDERMAL | Status: DC | PRN

## 2015-06-02 MED ORDER — CEFAZOLIN SODIUM-DEXTROSE 2-3 GM-% IV SOLR
INTRAVENOUS | Status: AC
  Filled 2015-06-02: qty 50

## 2015-06-02 SURGICAL SUPPLY — 72 items
BANDAGE COBAN STERILE 2 (GAUZE/BANDAGES/DRESSINGS) IMPLANT
BIT DRILL 2.0 (BIT) ×2
BIT DRILL 2.0MM (BIT) ×1
BIT DRILL 2.5X2.75 QC CALB (BIT) ×2 IMPLANT
BIT DRILL 2XNS DISP SS SM FRAG (BIT) IMPLANT
BIT DRILL CALIBRATED 2.7 (BIT) ×1 IMPLANT
BIT DRILL CALIBRATED 2.7MM (BIT) ×1
BIT DRL 2XNS DISP SS SM FRAG (BIT) ×1
BLADE MINI RND TIP GREEN BEAV (BLADE) IMPLANT
BLADE SURG 15 STRL LF DISP TIS (BLADE) ×1 IMPLANT
BLADE SURG 15 STRL SS (BLADE) ×3
BNDG CMPR 9X4 STRL LF SNTH (GAUZE/BANDAGES/DRESSINGS) ×1
BNDG COHESIVE 4X5 TAN STRL (GAUZE/BANDAGES/DRESSINGS) ×3 IMPLANT
BNDG ESMARK 4X9 LF (GAUZE/BANDAGES/DRESSINGS) ×3 IMPLANT
BNDG GAUZE ELAST 4 BULKY (GAUZE/BANDAGES/DRESSINGS) ×6 IMPLANT
BRUSH SCRUB EZ PLAIN DRY (MISCELLANEOUS) IMPLANT
CHLORAPREP W/TINT 26ML (MISCELLANEOUS) ×3 IMPLANT
CORDS BIPOLAR (ELECTRODE) ×3 IMPLANT
COVER BACK TABLE 60X90IN (DRAPES) ×3 IMPLANT
COVER MAYO STAND STRL (DRAPES) ×3 IMPLANT
COVER SURGICAL LIGHT HANDLE (MISCELLANEOUS) ×2 IMPLANT
CUFF TOURNIQUET SINGLE 18IN (TOURNIQUET CUFF) IMPLANT
CUFF TOURNIQUET SINGLE 24IN (TOURNIQUET CUFF) IMPLANT
DRAPE C-ARM 42X72 X-RAY (DRAPES) ×3 IMPLANT
DRAPE EXTREMITY T 121X128X90 (DRAPE) ×3 IMPLANT
DRAPE SURG 17X23 STRL (DRAPES) ×3 IMPLANT
DRSG ADAPTIC 3X8 NADH LF (GAUZE/BANDAGES/DRESSINGS) ×3 IMPLANT
DRSG EMULSION OIL 3X3 NADH (GAUZE/BANDAGES/DRESSINGS) IMPLANT
GAUZE SPONGE 4X4 12PLY STRL (GAUZE/BANDAGES/DRESSINGS) ×3 IMPLANT
GLOVE BIO SURGEON STRL SZ7.5 (GLOVE) ×3 IMPLANT
GLOVE BIOGEL PI IND STRL 7.0 (GLOVE) ×1 IMPLANT
GLOVE BIOGEL PI IND STRL 8 (GLOVE) ×1 IMPLANT
GLOVE BIOGEL PI INDICATOR 7.0 (GLOVE) ×4
GLOVE BIOGEL PI INDICATOR 8 (GLOVE) ×2
GLOVE ECLIPSE 6.5 STRL STRAW (GLOVE) ×5 IMPLANT
GOWN STRL REUS W/ TWL LRG LVL3 (GOWN DISPOSABLE) ×2 IMPLANT
GOWN STRL REUS W/TWL LRG LVL3 (GOWN DISPOSABLE) ×6
GOWN STRL REUS W/TWL XL LVL3 (GOWN DISPOSABLE) ×3 IMPLANT
NDL HYPO 25X1 1.5 SAFETY (NEEDLE) IMPLANT
NEEDLE HYPO 25X1 1.5 SAFETY (NEEDLE) IMPLANT
NS IRRIG 1000ML POUR BTL (IV SOLUTION) ×3 IMPLANT
PACK BASIN DAY SURGERY FS (CUSTOM PROCEDURE TRAY) ×3 IMPLANT
PADDING CAST ABS 4INX4YD NS (CAST SUPPLIES)
PADDING CAST ABS COTTON 4X4 ST (CAST SUPPLIES) IMPLANT
PENCIL BUTTON HOLSTER BLD 10FT (ELECTRODE) ×3 IMPLANT
PLATE LOCK COMP 14H 3.5 FOOT (Plate) ×2 IMPLANT
RUBBERBAND STERILE (MISCELLANEOUS) IMPLANT
SCREW CORTICAL 2.7MM  20MM (Screw) ×2 IMPLANT
SCREW CORTICAL 2.7MM 20MM (Screw) IMPLANT
SCREW CORTICAL 3.5MM  16MM (Screw) ×2 IMPLANT
SCREW CORTICAL 3.5MM  20MM (Screw) ×2 IMPLANT
SCREW CORTICAL 3.5MM 14MM (Screw) ×2 IMPLANT
SCREW CORTICAL 3.5MM 16MM (Screw) IMPLANT
SCREW CORTICAL 3.5MM 18MM (Screw) ×2 IMPLANT
SCREW CORTICAL 3.5MM 20MM (Screw) IMPLANT
SCREW LOCK CORT STAR 3.5X12 (Screw) ×4 IMPLANT
SCREW LOCK CORT STAR 3.5X14 (Screw) ×2 IMPLANT
SCREW LOCK CORT STAR 3.5X16 (Screw) ×6 IMPLANT
SLEEVE SCD COMPRESS KNEE MED (MISCELLANEOUS) ×3 IMPLANT
STOCKINETTE 6  STRL (DRAPES) ×2
STOCKINETTE 6 STRL (DRAPES) ×1 IMPLANT
SUT VIC AB 0 CT1 27 (SUTURE) ×3
SUT VIC AB 0 CT1 27XBRD ANBCTR (SUTURE) IMPLANT
SUT VIC AB 2-0 CT3 27 (SUTURE) ×3 IMPLANT
SUT VICRYL 4-0 PS2 18IN ABS (SUTURE) IMPLANT
SUT VICRYL RAPIDE 4-0 (SUTURE) IMPLANT
SUT VICRYL RAPIDE 4/0 PS 2 (SUTURE) ×3 IMPLANT
SYR BULB 3OZ (MISCELLANEOUS) ×3 IMPLANT
SYRINGE 10CC LL (SYRINGE) IMPLANT
TOWEL OR 17X24 6PK STRL BLUE (TOWEL DISPOSABLE) ×3 IMPLANT
TOWEL OR NON WOVEN STRL DISP B (DISPOSABLE) ×3 IMPLANT
UNDERPAD 30X30 (UNDERPADS AND DIAPERS) ×3 IMPLANT

## 2015-06-02 NOTE — Anesthesia Procedure Notes (Addendum)
Procedure Name: LMA Insertion Date/Time: 06/02/2015 11:36 AM Performed by: Zenia ResidesPAYNE, Bay Jarquin D Pre-anesthesia Checklist: Patient identified, Emergency Drugs available, Suction available and Patient being monitored Patient Re-evaluated:Patient Re-evaluated prior to inductionOxygen Delivery Method: Circle System Utilized Preoxygenation: Pre-oxygenation with 100% oxygen Intubation Type: IV induction Ventilation: Mask ventilation without difficulty LMA: LMA inserted LMA Size: 5.0 Number of attempts: 1 Airway Equipment and Method: Bite block Placement Confirmation: positive ETCO2 Tube secured with: Tape Dental Injury: Teeth and Oropharynx as per pre-operative assessment
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
## 2015-06-02 NOTE — Interval H&P Note (Signed)
History and Physical Interval Note:  06/02/2015 9:45 AM  Jimmy Poole  has presented today for surgery, with the diagnosis of left ulna fracture  The various methods of treatment have been discussed with the patient and family. After consideration of risks, benefits and other options for treatment, the patient has consented to  Procedure(s) with comments: Open treatment left ulna fracture (Left) - NO PREOP BLOCK PLEASE as a surgical intervention .  The patient's history has been reviewed, patient examined, no change in status, stable for surgery.  I have reviewed the patient's chart and labs.  Questions were answered to the patient's satisfaction.     [name] A.

## 2015-06-02 NOTE — Discharge Instructions (Signed)
Discharge Instructions   You have a dressing with a plaster splint incorporated in it. Move your fingers as much as possible, making a full fist and fully opening the fist. Elevate your hand to reduce pain & swelling of the digits.  Ice over the operative site may be helpful to reduce pain & swelling.  DO NOT USE HEAT. Pain medicine has been prescribed for you.  Use your medicine as needed over the first 48 hours, and then you can begin to taper your use.  You may use Tylenol in place of your prescribed pain medication, but not IN ADDITION to it. Leave the dressing in place until you return to our office.  You may shower, but keep the bandage clean & dry.  You may drive a car when you are off of prescription pain medications and can safely control your vehicle with both hands. Call our office to make a follow up appointment 10-15 days from surgery date.   Please call [phone] during normal business hours or [phone] after hours for any problems. Including the following:  - excessive redness of the incisions - drainage for more than 4 days - fever of more than 101.5 F  *Please note that pain medications will not be refilled after hours or on weekends.    Post Anesthesia Home Care Instructions  Activity: Get plenty of rest for the remainder of the day. A responsible adult should stay with you for 24 hours following the procedure.  For the next 24 hours, DO NOT: -Drive a car -Advertising copywriterperate machinery -Drink alcoholic beverages -Take any medication unless instructed by your physician -Make any legal decisions or sign important papers.  Meals: Start with liquid foods such as gelatin or soup. Progress to regular foods as tolerated. Avoid greasy, spicy, heavy foods. If nausea and/or vomiting occur, drink only clear liquids until the nausea and/or vomiting subsides. Call your physician if vomiting continues.  Special Instructions/Symptoms: Your throat may feel dry or sore from the  anesthesia or the breathing tube placed in your throat during surgery. If this causes discomfort, gargle with warm salt water. The discomfort should disappear within 24 hours.  If you had a scopolamine patch placed behind your ear for the management of post- operative nausea and/or vomiting:  1. The medication in the patch is effective for 72 hours, after which it should be removed.  Wrap patch in a tissue and discard in the trash. Wash hands thoroughly with soap and water. 2. You may remove the patch earlier than 72 hours if you experience unpleasant side effects which may include dry mouth, dizziness or visual disturbances. 3. Avoid touching the patch. Wash your hands with soap and water after contact with the patch.
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
## 2015-06-02 NOTE — Transfer of Care (Signed)
Immediate Anesthesia Transfer of Care Note  Patient: Jimmy Poole  Procedure(s) Performed: Procedure(s) with comments: Open treatment left ulna fracture (Left) - NO PREOP BLOCK PLEASE  Patient Location: PACU  Anesthesia Type:General  Level of Consciousness: awake and patient cooperative  Airway & Oxygen Therapy: Patient Spontanous Breathing and Patient connected to face mask oxygen  Post-op Assessment: Report given to RN and Post -op Vital signs reviewed and stable  Post vital signs: Reviewed and stable  Last Vitals:  Filed Vitals:   06/02/15 0923  BP: 152/93  Pulse: 88  Temp: 36.9 C  Resp: 20    Complications: No apparent anesthesia complications
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
## 2015-06-03 ENCOUNTER — Encounter (HOSPITAL_BASED_OUTPATIENT_CLINIC_OR_DEPARTMENT_OTHER): Payer: Self-pay | Admitting: Orthopedic Surgery

## 2018-08-01 ENCOUNTER — Encounter (HOSPITAL_COMMUNITY): Payer: Self-pay

## 2018-08-01 ENCOUNTER — Emergency Department (HOSPITAL_COMMUNITY)
Admission: EM | Admit: 2018-08-01 | Discharge: 2018-08-01 | Disposition: A | Discharge status: Home / Self Care | Payer: Self-pay | Attending: Emergency Medicine | Admitting: Emergency Medicine

## 2018-08-01 ENCOUNTER — Emergency Department (HOSPITAL_COMMUNITY): Payer: Self-pay

## 2018-08-01 DIAGNOSIS — J45909 Unspecified asthma, uncomplicated: Secondary | ICD-10-CM | POA: Insufficient documentation

## 2018-08-01 DIAGNOSIS — J069 Acute upper respiratory infection, unspecified: Secondary | ICD-10-CM | POA: Insufficient documentation

## 2018-08-01 DIAGNOSIS — B9789 Other viral agents as the cause of diseases classified elsewhere: Secondary | ICD-10-CM

## 2018-08-01 DIAGNOSIS — F17210 Nicotine dependence, cigarettes, uncomplicated: Secondary | ICD-10-CM | POA: Insufficient documentation

## 2018-08-01 MED ORDER — ALBUTEROL SULFATE HFA 108 (90 BASE) MCG/ACT IN AERS: 1.0000 | INHALATION_SPRAY | Freq: Four times a day (QID) | RESPIRATORY_TRACT | 0 refills | Status: DC | PRN

## 2018-08-01 MED ORDER — GUAIFENESIN-CODEINE 100-10 MG/5ML PO SYRP: 5.0000 mL | ORAL_SOLUTION | Freq: Three times a day (TID) | ORAL | 0 refills | Status: DC | PRN

## 2018-08-01 MED ORDER — BENZONATATE 100 MG PO CAPS: 200.0000 mg | ORAL_CAPSULE | Freq: Three times a day (TID) | ORAL | 0 refills | Status: DC

## 2018-08-01 NOTE — ED Triage Notes (Signed)
Pt endorses productive cough with clear mucous x 6 days. Afebrile. Pt states "I threw up last night when I was coughing" VSS

## 2018-08-01 NOTE — Discharge Instructions (Addendum)
Take the following medications to help with your symptoms. Return to ED for worsening symptoms, shortness of breath, coughing up blood, abdominal pain or fever.
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
## 2018-08-01 NOTE — ED Notes (Signed)
Patient verbalizes understanding of discharge instructions. Opportunity for questioning and answers were provided. 

## 2020-07-19 ENCOUNTER — Emergency Department (HOSPITAL_COMMUNITY): Payer: HRSA Program

## 2020-07-19 ENCOUNTER — Emergency Department (HOSPITAL_COMMUNITY)
Admission: EM | Admit: 2020-07-19 | Discharge: 2020-07-20 | Disposition: A | Discharge status: Home / Self Care | Payer: HRSA Program | Attending: Emergency Medicine | Admitting: Emergency Medicine

## 2020-07-19 ENCOUNTER — Other Ambulatory Visit: Payer: Self-pay

## 2020-07-19 DIAGNOSIS — J45909 Unspecified asthma, uncomplicated: Secondary | ICD-10-CM | POA: Insufficient documentation

## 2020-07-19 DIAGNOSIS — U071 COVID-19: Secondary | ICD-10-CM | POA: Diagnosis not present

## 2020-07-19 DIAGNOSIS — F17210 Nicotine dependence, cigarettes, uncomplicated: Secondary | ICD-10-CM | POA: Insufficient documentation

## 2020-07-19 DIAGNOSIS — R509 Fever, unspecified: Secondary | ICD-10-CM | POA: Diagnosis present

## 2020-07-19 LAB — BASIC METABOLIC PANEL
Anion gap: 9 (ref 5–15)
BUN: 12 mg/dL (ref 6–20)
CO2: 22 mmol/L (ref 22–32)
Calcium: 9 mg/dL (ref 8.9–10.3)
Chloride: 104 mmol/L (ref 98–111)
Creatinine, Ser: 1.37 mg/dL — ABNORMAL HIGH (ref 0.61–1.24)
GFR, Estimated: >60 mL/min (ref >60)
Glucose, Bld: 94 mg/dL (ref 70–99)
Potassium: 3.6 mmol/L (ref 3.5–5.1)
Sodium: 135 mmol/L (ref 135–145)

## 2020-07-19 LAB — CBC
HCT: 46.1 % (ref 39.0–52.0)
Hemoglobin: 14.3 g/dL (ref 13.0–17.0)
MCH: 29.7 pg (ref 26.0–34.0)
MCHC: 31 g/dL (ref 30.0–36.0)
MCV: 95.8 fL (ref 80.0–100.0)
Platelets: 216 10*3/uL (ref 150–400)
RBC: 4.81 MIL/uL (ref 4.22–5.81)
RDW: 13.7 % (ref 11.5–15.5)
WBC: 7.5 10*3/uL (ref 4.0–10.5)
nRBC: 0.3 % — ABNORMAL HIGH (ref 0.0–0.2)

## 2020-07-19 LAB — TROPONIN I (HIGH SENSITIVITY): Troponin I (High Sensitivity): 7 ng/L (ref <18)

## 2020-07-19 LAB — RESP PANEL BY RT-PCR (FLU A&B, COVID) ARPGX2
Influenza A by PCR: NEGATIVE
Influenza B by PCR: NEGATIVE
SARS Coronavirus 2 by RT PCR: POSITIVE — AB

## 2020-07-19 MED ORDER — ACETAMINOPHEN 325 MG PO TABS
650.0000 mg | ORAL_TABLET | Freq: Once | ORAL | Status: AC
  Administered 2020-07-20: 650 mg via ORAL
  Filled 2020-07-19: qty 2

## 2020-07-19 NOTE — ED Triage Notes (Signed)
Pt presents to ED POv. Pt c/o fever and CP. Pt denies covid exposure. Pt is unvaccinated for covid. resp e/u

## 2020-07-20 MED ORDER — ALBUTEROL SULFATE HFA 108 (90 BASE) MCG/ACT IN AERS
2.0000 | INHALATION_SPRAY | Freq: Once | RESPIRATORY_TRACT | Status: AC
  Administered 2020-07-20: 2 via RESPIRATORY_TRACT
  Filled 2020-07-20: qty 6.7

## 2020-07-20 NOTE — Discharge Instructions (Addendum)
You have tested POSITIVE for COVID 19 today. Please self isolate for the next 10 days.  Continue monitoring your symptoms at home. Take Tylenol as needed for fevers > 100.4. Drink plenty of fluids to stay hydrated. You can use the albuterol inhaler (2 puffs every 4 hours) as needed for chest tightness.   It is recommended that you buy a pulse oximeter (can be bought off of [employer]) to check your oxygen levels. If you begin feeling short of breath you can place the machine on your finger to check your oxygen levels. If the levels are persistently < 90% you will need to come back to the ED IMMEDIATELY for further evaluation.   Follow up with your PCP regarding your ED visit. If you do not have one you can follow up with [facility] for primary care needs.

## 2020-07-20 NOTE — ED Notes (Signed)
Patient moved to appropriate waiting area 

## 2020-07-20 NOTE — ED Provider Notes (Signed)
[facility] EMERGENCY DEPARTMENT Provider Note   CSN: [id number] Arrival date & time: 07/19/20  2121     History Chief Complaint  Patient presents with  . Fever    Jimmy Poole is a 40 y.o. male with PMHx asthma who presents to the ED today with complaint of fevers with tmax "105.7" per patient that began last night. Pt reports last night out of nowhere he began feeling fatigued and decided to check his temperature orally. Pt states that after that he began feeling a diffuse chest tightness and a mild cough. Denies any recent sick contacts. He is unvaccinated against COVID 19. Denies chills, body aches, shortness of breath, abdominal pain, nausea, vomiting, diarrhea, hemoptysis, leg swelling, or any other associated symptoms.    The history is provided by the patient and medical records.       Past Medical History:  Diagnosis Date  . Asthma     There are no problems to display for this patient.   Past Surgical History:  Procedure Laterality Date  . ANKLE SURGERY    . HERNIA REPAIR    . ORIF ULNAR FRACTURE Left 06/02/2015   Procedure: Open treatment left ulna fracture;  Surgeon: Mack Hook, MD;  Location: [facility];  Service: Orthopedics;  Laterality: Left;  NO PREOP BLOCK PLEASE       No family history on file.  Social History   Tobacco Use  . Smoking status: Current Every Day Smoker    Packs/day: 0.03    Types: Cigarettes  Substance Use Topics  . Alcohol use: Yes    Alcohol/week: 2.0 standard drinks    Types: 2 Shots of liquor per week  . Drug use: No    Home Medications Prior to Admission medications   Medication Sig Start Date End Date Taking? Authorizing Provider  albuterol (PROVENTIL HFA;VENTOLIN HFA) 108 (90 Base) MCG/ACT inhaler Inhale 1-2 puffs into the lungs every 6 (six) hours as needed for wheezing or shortness of breath. 08/01/18   Khatri, Hina, PA-C  benzonatate (TESSALON) 100 MG capsule Take 2 capsules  (200 mg total) by mouth every 8 (eight) hours. 08/01/18   Khatri, Hina, PA-C  cephALEXin (KEFLEX) 500 MG capsule Take 1 capsule (500 mg total) by mouth 4 (four) times daily. 06/02/15   Mack Hook, MD  guaiFENesin-codeine [facility]) 100-10 MG/5ML syrup Take 5 mLs by mouth 3 (three) times daily as needed for cough. 08/01/18   Khatri, Hina, PA-C  oxyCODONE-acetaminophen (ROXICET) 5-325 MG tablet Take 1-2 tablets by mouth every 4 (four) hours as needed. 06/01/15   Mack Hook, MD  traMADol (ULTRAM) 50 MG tablet Take 1 tablet (50 mg total) by mouth every 6 (six) hours as needed (Not to exceed 8 per day.). 06/02/15   Mack Hook, MD    Allergies    Patient has no known allergies.  Review of Systems   Review of Systems  Constitutional: Positive for fatigue and fever. Negative for chills.  Respiratory: Positive for cough and chest tightness. Negative for shortness of breath.   Cardiovascular: Negative for palpitations and leg swelling.  Gastrointestinal: Negative for abdominal pain, diarrhea, nausea and vomiting.  All other systems reviewed and are negative.   Physical Exam Updated Vital Signs BP 117/63 (BP Location: Left Arm)   Pulse 79   Temp (!) 100.6 F (38.1 C) (Oral)   Resp 18   Ht 6\' 8"  (2.032 m)   Wt 124.7 kg   SpO2 97%  BMI 30.21 kg/m   Physical Exam Vitals and nursing note reviewed.  Constitutional:      Appearance: He is not ill-appearing or diaphoretic.  HENT:     Head: Normocephalic and atraumatic.  Eyes:     Conjunctiva/sclera: Conjunctivae normal.  Cardiovascular:     Rate and Rhythm: Normal rate and regular rhythm.     Pulses: Normal pulses.  Pulmonary:     Effort: Pulmonary effort is normal.     Breath sounds: Normal breath sounds. No wheezing, rhonchi or rales.     Comments: Speaking in full sentences without difficulty. Mild dry cough in the room. Satting 97% on RA. LCTAB.  Chest:     Chest wall: No tenderness.  Abdominal:     Palpations:  Abdomen is soft.     Tenderness: There is no abdominal tenderness. There is no guarding or rebound.  Musculoskeletal:     Cervical back: Neck supple.     Right lower leg: No edema.     Left lower leg: No edema.  Skin:    General: Skin is warm and dry.  Neurological:     Mental Status: He is alert.     ED Results / Procedures / Treatments   Labs (all labs ordered are listed, but only abnormal results are displayed) Labs Reviewed  RESP PANEL BY RT-PCR (FLU A&B, COVID) ARPGX2 - Abnormal; Notable for the following components:      Result Value   SARS Coronavirus 2 by RT PCR POSITIVE (*)    All other components within normal limits  BASIC METABOLIC PANEL - Abnormal; Notable for the following components:   Creatinine, Ser 1.37 (*)    All other components within normal limits  CBC - Abnormal; Notable for the following components:   nRBC 0.3 (*)    All other components within normal limits  TROPONIN I (HIGH SENSITIVITY)    EKG EKG Interpretation  Date/Time:  Saturday July 19 2020 22:00:05 EST Ventricular Rate:  70 PR Interval:  138 QRS Duration: 86 QT Interval:  342 QTC Calculation: 369 R Axis:   55 Text Interpretation: Normal sinus rhythm Confirmed by Nicanor AlconPalumbo, April ([id number]) on 07/20/2020 5:52:19 AM   Radiology DG Chest Portable 1 View  Result Date: 07/19/2020 CLINICAL DATA:  Shortness of breath EXAM: PORTABLE CHEST 1 VIEW COMPARISON:  None. FINDINGS: The heart size and mediastinal contours are within normal limits. Both lungs are clear. The visualized skeletal structures are unremarkable. IMPRESSION: No active disease. Electronically Signed   By: Jonna ClarkBindu  Avutu M.D.   On: 07/19/2020 22:40    Procedures Procedures (including critical care time)  Medications Ordered in ED Medications  acetaminophen (TYLENOL) tablet 650 mg (650 mg Oral Given 07/20/20 0700)  albuterol (VENTOLIN HFA) 108 (90 Base) MCG/ACT inhaler 2 puff (2 puffs Inhalation Given 07/20/20 0700)    ED Course   I have reviewed the triage vital signs and the nursing notes.  Pertinent labs & imaging results that were available during my care of the patient were reviewed by me and considered in my medical decision making (see chart for details).    MDM Rules/Calculators/A&P                          40 year old male who presents to the ED today with complaints of fevers with T-max 105.7, fatigue, cough, chest tightness that began last night.  He is unvaccinated against COVID-19.  States he felt so bad that he called EMS  to bring him to the ED.  On arrival he was febrile at 103.4, nontachycardic, nontachypneic.  Blood pressure stable 133/71.  Tylenol was ordered while in the waiting room however was not given it appears.  Repeat vitals several hours later with a temp of 100.6.  Patient had lab work done while in the waiting room including a CBC, BMP, chest x-ray, EKG, COVID test, troponin.  KG with normal sinus rhythm and no acute ischemic changes.  Chest x-ray clear without signs of pneumonia.  CBC without leukocytosis and hemoglobin stable at 14.3.  BMP with a creatinine 1.37 however this does appear to be around patient's baseline.  His troponin is 7.  COVID test has returned positive.  When patient is brought back to the room I personally visualized him ambulating in the hallway without difficulty and without respiratory compromise.  Satting 98% on room air.  I also personally ambulated patient with pulse ox on and he stayed above 95%.  I repeated his temperature while in the room which was 101.6.  Again he has not received his Tylenol, will plan to provide Tylenol prior to discharge.  We will also provide albuterol inhaler given his complaint of chest tightness.  He is able to speak in full sentences without difficulty and his lungs are clear to auscultation bilaterally without any signs of wheezing.  It does appear patient has a history of asthma although states that he has not had an inhaler in multiple years.   His symptoms do not sound consistent with ACS and given his initial troponin was 7 I do not feel he needs a repeat at this time.  His symptoms only began yesterday and he is very early in his illness course of COVID-19, it is very unlikely that he has a PE today.  Plan to discharge home with instructions to self isolate.  Patient instructed to buy pulse oximeter to check his oxygen saturation and to return to the ED if it is persistently in the 80s.  He is also instructed to take over-the-counter medications for Tylenol and cough.  Drink plenty of fluids to stay hydrated and to follow-up with his PCP.  Patient is in agreement with plan and stable for discharge.   This note was prepared using Dragon voice recognition software and may include unintentional dictation errors due to the inherent limitations of voice recognition software.  Jimmy Poole was evaluated in Emergency Department on 07/20/2020 for the symptoms described in the history of present illness. He was evaluated in the context of the global COVID-19 pandemic, which necessitated consideration that the patient might be at risk for infection with the SARS-CoV-2 virus that causes COVID-19. Institutional protocols and algorithms that pertain to the evaluation of patients at risk for COVID-19 are in a state of rapid change based on information released by regulatory bodies including the CDC and federal and state organizations. These policies and algorithms were followed during the patient's care in the ED.  Final Clinical Impression(s) / ED Diagnoses Final diagnoses:  COVID-19    Rx / DC Orders ED Discharge Orders    None       Discharge Instructions     You have tested POSITIVE for COVID 19 today. Please self isolate for the next 10 days.  Continue monitoring your symptoms at home. Take Tylenol as needed for fevers > 100.4. Drink plenty of fluids to stay hydrated. You can use the albuterol inhaler (2 puffs every 4 hours) as needed  for chest tightness.  It is recommended that you buy a pulse oximeter (can be bought off of [employer]) to check your oxygen levels. If you begin feeling short of breath you can place the machine on your finger to check your oxygen levels. If the levels are persistently < 90% you will need to come back to the ED IMMEDIATELY for further evaluation.   Follow up with your PCP regarding your ED visit. If you do not have one you can follow up with [facility] for primary care needs.        Tanda Rockers, PA-C 07/20/20 4332    Palumbo, April, MD 07/20/20 2308

## 2020-08-13 ENCOUNTER — Other Ambulatory Visit: Payer: Self-pay

## 2021-08-25 ENCOUNTER — Encounter (HOSPITAL_BASED_OUTPATIENT_CLINIC_OR_DEPARTMENT_OTHER): Payer: Self-pay | Admitting: Emergency Medicine

## 2021-08-25 ENCOUNTER — Other Ambulatory Visit: Payer: Self-pay

## 2021-08-25 ENCOUNTER — Other Ambulatory Visit (HOSPITAL_BASED_OUTPATIENT_CLINIC_OR_DEPARTMENT_OTHER): Payer: Self-pay

## 2021-08-25 ENCOUNTER — Emergency Department (HOSPITAL_BASED_OUTPATIENT_CLINIC_OR_DEPARTMENT_OTHER)
Admission: EM | Admit: 2021-08-25 | Discharge: 2021-08-25 | Disposition: A | Discharge status: Home / Self Care | Payer: Commercial Managed Care - HMO | Attending: Emergency Medicine | Admitting: Emergency Medicine

## 2021-08-25 DIAGNOSIS — R7309 Other abnormal glucose: Secondary | ICD-10-CM | POA: Insufficient documentation

## 2021-08-25 DIAGNOSIS — M792 Neuralgia and neuritis, unspecified: Secondary | ICD-10-CM | POA: Diagnosis not present

## 2021-08-25 DIAGNOSIS — M5412 Radiculopathy, cervical region: Secondary | ICD-10-CM

## 2021-08-25 DIAGNOSIS — R209 Unspecified disturbances of skin sensation: Secondary | ICD-10-CM | POA: Diagnosis not present

## 2021-08-25 DIAGNOSIS — R531 Weakness: Secondary | ICD-10-CM | POA: Diagnosis not present

## 2021-08-25 DIAGNOSIS — M79602 Pain in left arm: Secondary | ICD-10-CM | POA: Diagnosis not present

## 2021-08-25 DIAGNOSIS — M542 Cervicalgia: Secondary | ICD-10-CM | POA: Diagnosis present

## 2021-08-25 LAB — CBG MONITORING, ED: Glucose-Capillary: 108 mg/dL — ABNORMAL HIGH (ref 70–99)

## 2021-08-25 MED ORDER — METHYLPREDNISOLONE 4 MG PO TBPK
ORAL_TABLET | ORAL | 0 refills | Status: AC
  Filled 2021-08-25: qty 21, 6d supply, fill #0

## 2021-08-25 MED ORDER — KETOROLAC TROMETHAMINE 10 MG PO TABS
10.0000 mg | ORAL_TABLET | Freq: Four times a day (QID) | ORAL | 0 refills | Status: AC | PRN
  Filled 2021-08-25: qty 20, 5d supply, fill #0

## 2021-08-25 MED ORDER — GABAPENTIN 100 MG PO CAPS
100.0000 mg | ORAL_CAPSULE | Freq: Three times a day (TID) | ORAL | 0 refills | Status: AC
  Filled 2021-08-25: qty 90, 30d supply, fill #0

## 2021-08-25 MED ORDER — GABAPENTIN 100 MG PO CAPS
100.0000 mg | ORAL_CAPSULE | Freq: Once | ORAL | Status: AC
  Administered 2021-08-25: 100 mg via ORAL
  Filled 2021-08-25: qty 1

## 2021-08-25 MED ORDER — KETOROLAC TROMETHAMINE 30 MG/ML IJ SOLN
30.0000 mg | Freq: Once | INTRAMUSCULAR | Status: AC
  Administered 2021-08-25: 30 mg via INTRAMUSCULAR
  Filled 2021-08-25: qty 1

## 2021-08-25 MED ORDER — ACETAMINOPHEN 500 MG PO TABS
1000.0000 mg | ORAL_TABLET | Freq: Once | ORAL | Status: AC
  Administered 2021-08-25: 1000 mg via ORAL
  Filled 2021-08-25: qty 2

## 2021-08-25 NOTE — ED Triage Notes (Signed)
Pt arrives to ED with c/o left arm pain for nine days.

## 2021-08-25 NOTE — Discharge Instructions (Addendum)
You were evaluated in the Emergency Department and after careful evaluation, we did not find any emergent condition requiring admission or further testing in the hospital.  Your exam/testing today was overall reassuring.  Symptoms are consistent with likely cervical radiculopathy or nerve impingement.  Recommend a short course of NSAIDs for pain control, steroid taper, gabapentin for nerve pain.  Please return to the Emergency Department if you experience any worsening of your condition.  Thank you for allowing [place] to be a part of your care.

## 2021-08-25 NOTE — ED Notes (Signed)
Patient verbalizes understanding of discharge instructions. Opportunity for questioning and answers were provided. Patient discharged from ED.  °
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
## 2021-11-12 ENCOUNTER — Encounter (HOSPITAL_BASED_OUTPATIENT_CLINIC_OR_DEPARTMENT_OTHER): Payer: Self-pay | Admitting: Urology

## 2021-11-12 ENCOUNTER — Emergency Department (HOSPITAL_BASED_OUTPATIENT_CLINIC_OR_DEPARTMENT_OTHER)
Admission: EM | Admit: 2021-11-12 | Discharge: 2021-11-13 | Disposition: A | Discharge status: Home / Self Care | Payer: Commercial Managed Care - HMO | Attending: Emergency Medicine | Admitting: Emergency Medicine

## 2021-11-12 ENCOUNTER — Other Ambulatory Visit: Payer: Self-pay

## 2021-11-12 DIAGNOSIS — Z202 Contact with and (suspected) exposure to infections with a predominantly sexual mode of transmission: Secondary | ICD-10-CM | POA: Insufficient documentation

## 2021-11-12 DIAGNOSIS — M545 Low back pain, unspecified: Secondary | ICD-10-CM | POA: Insufficient documentation

## 2021-11-12 LAB — URINALYSIS, ROUTINE W REFLEX MICROSCOPIC
Bilirubin Urine: NEGATIVE
Glucose, UA: NEGATIVE mg/dL
Hgb urine dipstick: NEGATIVE
Ketones, ur: NEGATIVE mg/dL
Nitrite: NEGATIVE
Protein, ur: 30 mg/dL — AB
Specific Gravity, Urine: 1.02 (ref 1.005–1.030)
pH: 7 (ref 5.0–8.0)

## 2021-11-12 LAB — URINALYSIS, MICROSCOPIC (REFLEX)

## 2021-11-12 MED ORDER — METRONIDAZOLE 500 MG PO TABS
2000.0000 mg | ORAL_TABLET | Freq: Once | ORAL | Status: AC
Start: 2021-11-13 — End: 2021-11-13
  Administered 2021-11-13: 2000 mg via ORAL
  Filled 2021-11-12: qty 4

## 2021-11-12 MED ORDER — CEFTRIAXONE SODIUM 500 MG IJ SOLR
500.0000 mg | Freq: Once | INTRAMUSCULAR | Status: AC
  Administered 2021-11-13: 500 mg via INTRAMUSCULAR
  Filled 2021-11-12: qty 500

## 2021-11-12 MED ORDER — LIDOCAINE HCL (PF) 1 % IJ SOLN
2.0000 mL | Freq: Once | INTRAMUSCULAR | Status: AC
  Administered 2021-11-13: 2 mL
  Filled 2021-11-12: qty 5

## 2021-11-12 MED ORDER — AZITHROMYCIN 250 MG PO TABS
1000.0000 mg | ORAL_TABLET | Freq: Once | ORAL | Status: AC
Start: 2021-11-13 — End: 2021-11-13
  Administered 2021-11-13: 1000 mg via ORAL
  Filled 2021-11-12: qty 4

## 2021-11-12 NOTE — ED Triage Notes (Signed)
Pt states wants STD testing because condom popped 1 month ago  ?States lower back pain x 4 days ?Denies any dysuria or penile discharge  ? ?

## 2021-11-12 NOTE — Discharge Instructions (Signed)
Follow up with your doctor in the office.  Return for worsening pain back, fever.  ?

## 2021-11-13 LAB — RPR: RPR Ser Ql: NONREACTIVE

## 2021-11-13 LAB — HIV ANTIBODY (ROUTINE TESTING W REFLEX): HIV Screen 4th Generation wRfx: NONREACTIVE

## 2021-11-13 NOTE — ED Provider Notes (Signed)
?[facility] EMERGENCY DEPARTMENT ?Provider Note ? ? ?CSN: [id number] ?Arrival date & time: 11/12/21  2300 ? ?  ? ?History ? ?Chief Complaint  ?Patient presents with  ? Exposure to STD  ? ? ?BLYTHE HARTSHORN is a 41 y.o. male. ? ?41 yo M with a chief complaints of possible STD exposure.  The patient states that he had a condom break last week with sexual intercourse.  Has not had any penile symptoms no discharge no drainage no rash no testicular pain.  He developed some low back pain that is worse when he lays back flat.  He was speaking with his mom and she thought it could be chlamydia.  He is here to be tested and treated. ? ? ?Exposure to STD ? ? ?  ? ?Home Medications ?Prior to Admission medications   ?Medication Sig Start Date End Date Taking? Authorizing Provider  ?albuterol (PROVENTIL HFA;VENTOLIN HFA) 108 (90 Base) MCG/ACT inhaler Inhale 1-2 puffs into the lungs every 6 (six) hours as needed for wheezing or shortness of breath. 08/01/18   Khatri, Hina, PA-C  ?benzonatate (TESSALON) 100 MG capsule Take 2 capsules (200 mg total) by mouth every 8 (eight) hours. 08/01/18   Dietrich Pates, PA-C  ?cephALEXin (KEFLEX) 500 MG capsule Take 1 capsule (500 mg total) by mouth 4 (four) times daily. 06/02/15   Mack Hook, MD  ?gabapentin (NEURONTIN) 100 MG capsule Take 1 capsule (100 mg total) by mouth 3 (three) times daily. 08/25/21 09/24/21  Ernie Avena, MD  ?guaiFENesin-codeine [facility]) 100-10 MG/5ML syrup Take 5 mLs by mouth 3 (three) times daily as needed for cough. 08/01/18   Khatri, Hina, PA-C  ?ketorolac (TORADOL) 10 MG tablet Take 1 tablet (10 mg total) by mouth every 6 (six) hours as needed. 08/25/21   Ernie Avena, MD  ?methylPREDNISolone (MEDROL DOSEPAK) 4 MG TBPK tablet Follow instructions on the packet 08/25/21   Ernie Avena, MD  ?oxyCODONE-acetaminophen (ROXICET) 5-325 MG tablet Take 1-2 tablets by mouth every 4 (four) hours as needed. 06/01/15   Mack Hook, MD  ?traMADol (ULTRAM)  50 MG tablet Take 1 tablet (50 mg total) by mouth every 6 (six) hours as needed (Not to exceed 8 per day.). 06/02/15   Mack Hook, MD  ?   ? ?Allergies    ?Patient has no known allergies.   ? ?Review of Systems   ?Review of Systems ? ?Physical Exam ?Updated Vital Signs ?BP (!) 155/98 (BP Location: Right Arm)   Pulse 66   Temp 98.3 ?F (36.8 ?C) (Oral)   Resp 18   Ht 6\' 8"  (2.032 m)   Wt 129.3 kg   SpO2 98%   BMI 31.31 kg/m?  ?Physical Exam ?Vitals and nursing note reviewed.  ?Constitutional:   ?   Appearance: He is well-developed.  ?HENT:  ?   Head: Normocephalic and atraumatic.  ?Eyes:  ?   Pupils: Pupils are equal, round, and reactive to light.  ?Neck:  ?   Vascular: No JVD.  ?Cardiovascular:  ?   Rate and Rhythm: Normal rate and regular rhythm.  ?   Heart sounds: No murmur heard. ?  No friction rub. No gallop.  ?Pulmonary:  ?   Effort: No respiratory distress.  ?   Breath sounds: No wheezing.  ?Abdominal:  ?   General: There is no distension.  ?   Tenderness: There is no abdominal tenderness. There is no guarding or rebound.  ?Musculoskeletal:     ?   General:  Normal range of motion.  ?   Cervical back: Normal range of motion and neck supple.  ?   Comments: No midline spinal tenderness step-offs or deformities.  ?Skin: ?   Coloration: Skin is not pale.  ?   Findings: No rash.  ?Neurological:  ?   Mental Status: He is alert and oriented to person, place, and time.  ?Psychiatric:     ?   Behavior: Behavior normal.  ? ? ?ED Results / Procedures / Treatments   ?Labs ?(all labs ordered are listed, but only abnormal results are displayed) ?Labs Reviewed  ?URINALYSIS, ROUTINE W REFLEX MICROSCOPIC - Abnormal; Notable for the following components:  ?    Result Value  ? Protein, ur 30 (*)   ? Leukocytes,Ua TRACE (*)   ? All other components within normal limits  ?URINALYSIS, MICROSCOPIC (REFLEX) - Abnormal; Notable for the following components:  ? Bacteria, UA FEW (*)   ? All other components within normal  limits  ?RPR  ?HIV ANTIBODY (ROUTINE TESTING W REFLEX)  ?GC/CHLAMYDIA PROBE AMP ([place]) NOT AT [facility]  ? ? ?EKG ?None ? ?Radiology ?No results found. ? ?Procedures ?Procedures  ? ? ?Medications Ordered in ED ?Medications  ?cefTRIAXone (ROCEPHIN) injection 500 mg (500 mg Intramuscular Given 11/13/21 0014)  ?azithromycin (ZITHROMAX) tablet 1,000 mg (1,000 mg Oral Given 11/13/21 0009)  ?metroNIDAZOLE (FLAGYL) tablet 2,000 mg (2,000 mg Oral Given 11/13/21 0011)  ?lidocaine (PF) (XYLOCAINE) 1 % injection 2 mL (2 mLs Other Given 11/13/21 0014)  ? ? ?ED Course/ Medical Decision Making/ A&P ?  ?                        ?Medical Decision Making ?Amount and/or Complexity of Data Reviewed ?Labs: ordered. ? ?Risk ?Prescription drug management. ? ? ?41 yo M here with concern for possible STD.  We will test and treat.  History of complaint is low back pain.  Most likely musculoskeletal, not reproducible on exam.  Seems mild in nature.  We will have him follow-up with his family doctor. ? ?12:19 AM:  I have discussed the diagnosis/risks/treatment options with the patient.  Evaluation and diagnostic testing in the emergency department does not suggest an emergent condition requiring admission or immediate intervention beyond what has been performed at this time.  They will follow up with  PCP. We also discussed returning to the ED immediately if new or worsening sx occur. We discussed the sx which are most concerning (e.g., sudden worsening pain, fever, inability to tolerate by mouth) that necessitate immediate return. Medications administered to the patient during their visit and any new prescriptions provided to the patient are listed below. ? ?Medications given during this visit ?Medications  ?cefTRIAXone (ROCEPHIN) injection 500 mg (500 mg Intramuscular Given 11/13/21 0014)  ?azithromycin (ZITHROMAX) tablet 1,000 mg (1,000 mg Oral Given 11/13/21 0009)  ?metroNIDAZOLE (FLAGYL) tablet 2,000 mg (2,000 mg Oral Given 11/13/21 0011)   ?lidocaine (PF) (XYLOCAINE) 1 % injection 2 mL (2 mLs Other Given 11/13/21 0014)  ? ? ? ?The patient appears reasonably screen and/or stabilized for discharge and I doubt any other medical condition or other [facility] requiring further screening, evaluation, or treatment in the ED at this time prior to discharge.  ? ? ? ? ? ? ? ? ?Final Clinical Impression(s) / ED Diagnoses ?Final diagnoses:  ?STD exposure  ? ? ?Rx / DC Orders ?ED Discharge Orders   ? ? None  ? ?  ? ? ?  ?  Melene Plan, DO ?11/13/21 0019 ? ?

## 2021-11-16 LAB — GC/CHLAMYDIA PROBE AMP (~~LOC~~) NOT AT ARMC
Chlamydia: NEGATIVE
Comment: NEGATIVE
Comment: NORMAL
Neisseria Gonorrhea: NEGATIVE

## 2022-05-17 ENCOUNTER — Emergency Department (HOSPITAL_BASED_OUTPATIENT_CLINIC_OR_DEPARTMENT_OTHER): Payer: Commercial Managed Care - HMO

## 2022-05-17 ENCOUNTER — Encounter (HOSPITAL_BASED_OUTPATIENT_CLINIC_OR_DEPARTMENT_OTHER): Payer: Self-pay | Admitting: Emergency Medicine

## 2022-05-17 ENCOUNTER — Emergency Department (HOSPITAL_BASED_OUTPATIENT_CLINIC_OR_DEPARTMENT_OTHER)
Admission: EM | Admit: 2022-05-17 | Discharge: 2022-05-17 | Disposition: A | Discharge status: Home / Self Care | Payer: Commercial Managed Care - HMO | Attending: Emergency Medicine | Admitting: Emergency Medicine

## 2022-05-17 DIAGNOSIS — S39012A Strain of muscle, fascia and tendon of lower back, initial encounter: Secondary | ICD-10-CM | POA: Diagnosis not present

## 2022-05-17 DIAGNOSIS — R519 Headache, unspecified: Secondary | ICD-10-CM | POA: Diagnosis not present

## 2022-05-17 DIAGNOSIS — S161XXA Strain of muscle, fascia and tendon at neck level, initial encounter: Secondary | ICD-10-CM | POA: Insufficient documentation

## 2022-05-17 DIAGNOSIS — Y92410 Unspecified street and highway as the place of occurrence of the external cause: Secondary | ICD-10-CM | POA: Diagnosis not present

## 2022-05-17 DIAGNOSIS — M25512 Pain in left shoulder: Secondary | ICD-10-CM | POA: Insufficient documentation

## 2022-05-17 DIAGNOSIS — M542 Cervicalgia: Secondary | ICD-10-CM | POA: Diagnosis present

## 2022-05-17 MED ORDER — METHOCARBAMOL 500 MG PO TABS
500.0000 mg | ORAL_TABLET | Freq: Once | ORAL | Status: AC
  Administered 2022-05-17: 500 mg via ORAL
  Filled 2022-05-17: qty 1

## 2022-05-17 MED ORDER — METHOCARBAMOL 500 MG PO TABS: 500.0000 mg | ORAL_TABLET | Freq: Two times a day (BID) | ORAL | 0 refills | Status: AC

## 2022-05-17 MED ORDER — ACETAMINOPHEN 325 MG PO TABS
650.0000 mg | ORAL_TABLET | Freq: Once | ORAL | Status: AC
  Administered 2022-05-17: 650 mg via ORAL
  Filled 2022-05-17: qty 2
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
## 2022-08-16 ENCOUNTER — Encounter (HOSPITAL_BASED_OUTPATIENT_CLINIC_OR_DEPARTMENT_OTHER): Payer: Self-pay

## 2022-08-16 ENCOUNTER — Other Ambulatory Visit: Payer: Self-pay

## 2022-08-16 ENCOUNTER — Emergency Department (HOSPITAL_BASED_OUTPATIENT_CLINIC_OR_DEPARTMENT_OTHER)
Admission: EM | Admit: 2022-08-16 | Discharge: 2022-08-17 | Disposition: A | Discharge status: Home / Self Care | Payer: Commercial Managed Care - HMO | Attending: Emergency Medicine | Admitting: Emergency Medicine

## 2022-08-16 DIAGNOSIS — F17210 Nicotine dependence, cigarettes, uncomplicated: Secondary | ICD-10-CM | POA: Insufficient documentation

## 2022-08-16 DIAGNOSIS — Z202 Contact with and (suspected) exposure to infections with a predominantly sexual mode of transmission: Secondary | ICD-10-CM | POA: Diagnosis present

## 2022-08-16 DIAGNOSIS — J45909 Unspecified asthma, uncomplicated: Secondary | ICD-10-CM | POA: Diagnosis not present

## 2022-08-16 MED ORDER — AZITHROMYCIN 1 G PO PACK
1.0000 g | PACK | Freq: Once | ORAL | Status: AC
  Administered 2022-08-16: 1 g via ORAL
  Filled 2022-08-16: qty 1
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
## 2022-08-17 LAB — HIV ANTIBODY (ROUTINE TESTING W REFLEX): HIV Screen 4th Generation wRfx: NONREACTIVE

## 2022-08-18 LAB — GC/CHLAMYDIA PROBE AMP (~~LOC~~) NOT AT ARMC
Chlamydia: NEGATIVE
Comment: NEGATIVE
Comment: NORMAL
Neisseria Gonorrhea: NEGATIVE

## 2023-10-02 ENCOUNTER — Ambulatory Visit
Admission: EM | Admit: 2023-10-02 | Discharge: 2023-10-02 | Disposition: A | Discharge status: Home / Self Care | Payer: Self-pay | Attending: Family Medicine | Admitting: Family Medicine

## 2023-10-02 DIAGNOSIS — J329 Chronic sinusitis, unspecified: Secondary | ICD-10-CM

## 2023-10-02 DIAGNOSIS — J4521 Mild intermittent asthma with (acute) exacerbation: Secondary | ICD-10-CM

## 2023-10-02 DIAGNOSIS — J40 Bronchitis, not specified as acute or chronic: Secondary | ICD-10-CM

## 2023-10-02 MED ORDER — PROMETHAZINE-DM 6.25-15 MG/5ML PO SYRP: 5.0000 mL | ORAL_SOLUTION | Freq: Three times a day (TID) | ORAL | 0 refills | Status: AC | PRN

## 2023-10-02 MED ORDER — ALBUTEROL SULFATE HFA 108 (90 BASE) MCG/ACT IN AERS: 1.0000 | INHALATION_SPRAY | Freq: Four times a day (QID) | RESPIRATORY_TRACT | 0 refills | Status: DC | PRN

## 2023-10-02 MED ORDER — PREDNISONE 20 MG PO TABS: ORAL_TABLET | ORAL | 0 refills | Status: AC

## 2023-10-02 MED ORDER — PROMETHAZINE-DM 6.25-15 MG/5ML PO SYRP: 5.0000 mL | ORAL_SOLUTION | Freq: Three times a day (TID) | ORAL | 0 refills | Status: DC | PRN

## 2023-10-02 MED ORDER — ALBUTEROL SULFATE HFA 108 (90 BASE) MCG/ACT IN AERS: 1.0000 | INHALATION_SPRAY | Freq: Four times a day (QID) | RESPIRATORY_TRACT | 0 refills | Status: AC | PRN

## 2023-10-02 MED ORDER — PREDNISONE 20 MG PO TABS: ORAL_TABLET | ORAL | 0 refills | Status: DC

## 2023-10-02 NOTE — ED Triage Notes (Signed)
 Cough and nasal congestion x 1 week.  Home Intervention: None

## 2023-10-02 NOTE — ED Provider Notes (Signed)
 [facility]  Note:  This document was prepared using Conservation officer, historic buildings and may include unintentional dictation errors.  MRN: [id number] DOB: 18-Nov-1980  Subjective:   Jimmy Poole is a 43 y.o. male presenting for 8 to 9-day history of persistent coughing, chest congestion, wheezing worse at night.  Had sinus congestion initially but is improved.  Has a history of asthma.  Needs a another inhaler.  Smokes cigarettes daily but is not a heavy smoker.  His girlfriend was seen yesterday and tested positive for influenza.  However, the patient was the one who was sick initially.  No current facility-administered medications for this encounter.  Current Outpatient Medications:    albuterol (PROVENTIL HFA;VENTOLIN HFA) 108 (90 Base) MCG/ACT inhaler, Inhale 1-2 puffs into the lungs every 6 (six) hours as needed for wheezing or shortness of breath., Disp: 1 Inhaler, Rfl: 0   ketorolac (TORADOL) 10 MG tablet, Take 1 tablet (10 mg total) by mouth every 6 (six) hours as needed., Disp: 20 tablet, Rfl: 0   gabapentin (NEURONTIN) 100 MG capsule, Take 1 capsule (100 mg total) by mouth 3 (three) times daily., Disp: 90 capsule, Rfl: 0   methocarbamol (ROBAXIN) 500 MG tablet, Take 1 tablet (500 mg total) by mouth 2 (two) times daily., Disp: 20 tablet, Rfl: 0   methylPREDNISolone (MEDROL DOSEPAK) 4 MG TBPK tablet, Follow instructions on the packet, Disp: 21 each, Rfl: 0   No Known Allergies  Past Medical History:  Diagnosis Date   Asthma      Past Surgical History:  Procedure Laterality Date   ANKLE SURGERY     HERNIA REPAIR     ORIF ULNAR FRACTURE Left 06/02/2015   Procedure: Open treatment left ulna fracture;  Surgeon: Mack Hook, MD;  Location: [facility];  Service: Orthopedics;  Laterality: Left;  NO PREOP BLOCK PLEASE    History reviewed. No pertinent family history.  Social History   Tobacco Use   Smoking status: Every Day     Current packs/day: 0.03    Types: Cigarettes  Vaping Use   Vaping status: Never Used  Substance Use Topics   Alcohol use: Yes    Alcohol/week: 2.0 standard drinks of alcohol    Types: 2 Shots of liquor per week   Drug use: No    ROS   Objective:   Vitals: BP 125/78 (BP Location: Left Arm)   Pulse 87   Temp 98.8 F (37.1 C) (Oral)   Resp 18   SpO2 95%   Physical Exam Constitutional:      General: He is not in acute distress.    Appearance: Normal appearance. He is well-developed and normal weight. He is not ill-appearing, toxic-appearing or diaphoretic.  HENT:     Head: Normocephalic and atraumatic.     Right Ear: Tympanic membrane, ear canal and external ear normal. No drainage, swelling or tenderness. No middle ear effusion. There is no impacted cerumen. Tympanic membrane is not erythematous or bulging.     Left Ear: Tympanic membrane, ear canal and external ear normal. No drainage, swelling or tenderness.  No middle ear effusion. There is no impacted cerumen. Tympanic membrane is not erythematous or bulging.     Nose: Nose normal. No congestion or rhinorrhea.     Mouth/Throat:     Mouth: Mucous membranes are moist.     Pharynx: No oropharyngeal exudate or posterior oropharyngeal erythema.  Eyes:     General: No scleral icterus.  Right eye: No discharge.        Left eye: No discharge.     Extraocular Movements: Extraocular movements intact.     Conjunctiva/sclera: Conjunctivae normal.  Cardiovascular:     Rate and Rhythm: Normal rate and regular rhythm.     Heart sounds: Normal heart sounds. No murmur heard.    No friction rub. No gallop.  Pulmonary:     Effort: Pulmonary effort is normal. No respiratory distress.     Breath sounds: No stridor. Wheezing (trace over mid to lower lung fields bilaterally) present. No rhonchi or rales.  Musculoskeletal:     Cervical back: Normal range of motion and neck supple. No rigidity. No muscular tenderness.  Neurological:      General: No focal deficit present.     Mental Status: He is alert and oriented to person, place, and time.  Psychiatric:        Mood and Affect: Mood normal.        Behavior: Behavior normal.        Thought Content: Thought content normal.     Assessment and Plan :   PDMP not reviewed this encounter.  1. Sinobronchitis   2. Mild intermittent asthma with (acute) exacerbation    Recommended management of sinobronchitis with albuterol, prednisone and general supportive care.  Will defer COVID and flu testing given timeline of illness and that his girlfriend tested positive yesterday.  Clinically, patient likely had influenza and is complicated to his sinobronchitis.  Counseled patient on potential for adverse effects with medications prescribed/recommended today, ER and return-to-clinic precautions discussed, patient verbalized understanding.    Wallis Bamberg, [place] 10/02/23 2130

## 2023-10-02 NOTE — Discharge Instructions (Addendum)
 We will manage this for a sinobronchitis infection that is a results of you having the flu and underlying asthma. For sore throat or cough try using a honey-based tea. Use 3 teaspoons of honey with juice squeezed from half lemon. Place shaved pieces of ginger into 1/2-1 cup of water and warm over stove top. Then mix the ingredients and repeat every 4 hours as needed. Please take Tylenol 500mg -650mg  once every 6 hours for fevers, aches and pains. Hydrate very well with at least 2 liters (80 ounces) of water. Eat light meals such as soups (chicken and noodles, chicken wild rice, vegetable).  Do not eat any foods that you are allergic to.  Start an antihistamine like Zyrtec (10mg  daily) for postnasal drainage, sinus congestion.  You can take this together with prednisone and albuterol inhaler. Use cough syrup as needed.
